# Patient Record
Sex: Female | Born: 1935 | Race: White | Hispanic: No | Smoking: Former smoker
Health system: Southern US, Community
[De-identification: ages and names within clinical notes are randomized; demographics above are authoritative.]

## PROBLEM LIST (undated history)

## (undated) DIAGNOSIS — I10 Essential (primary) hypertension: Secondary | ICD-10-CM

## (undated) DIAGNOSIS — K219 Gastro-esophageal reflux disease without esophagitis: Secondary | ICD-10-CM

## (undated) DIAGNOSIS — M1990 Unspecified osteoarthritis, unspecified site: Secondary | ICD-10-CM

## (undated) DIAGNOSIS — J449 Chronic obstructive pulmonary disease, unspecified: Secondary | ICD-10-CM

## (undated) DIAGNOSIS — E119 Type 2 diabetes mellitus without complications: Secondary | ICD-10-CM

## (undated) DIAGNOSIS — I739 Peripheral vascular disease, unspecified: Secondary | ICD-10-CM

## (undated) DIAGNOSIS — R609 Edema, unspecified: Secondary | ICD-10-CM

## (undated) HISTORY — DX: Edema, unspecified: R60.9

## (undated) HISTORY — DX: Essential (primary) hypertension: I10

## (undated) HISTORY — DX: Type 2 diabetes mellitus without complications: E11.9

## (undated) HISTORY — DX: Unspecified osteoarthritis, unspecified site: M19.90

## (undated) HISTORY — PX: GALLBLADDER SURGERY: SHX652

## (undated) HISTORY — PX: APPENDECTOMY: SHX54

## (undated) HISTORY — PX: ABDOMINAL HYSTERECTOMY: SHX81

---

## 2005-01-15 ENCOUNTER — Ambulatory Visit: Payer: Self-pay | Admitting: Internal Medicine

## 2005-03-11 ENCOUNTER — Ambulatory Visit: Payer: Self-pay | Admitting: Internal Medicine

## 2006-12-11 ENCOUNTER — Emergency Department: Payer: Self-pay | Admitting: Emergency Medicine

## 2008-03-24 ENCOUNTER — Inpatient Hospital Stay: Payer: Self-pay | Admitting: Internal Medicine

## 2008-03-24 ENCOUNTER — Other Ambulatory Visit: Payer: Self-pay

## 2008-11-21 ENCOUNTER — Inpatient Hospital Stay: Payer: Self-pay | Admitting: *Deleted

## 2008-11-26 ENCOUNTER — Encounter: Payer: Self-pay | Admitting: Internal Medicine

## 2008-12-01 ENCOUNTER — Encounter: Payer: Self-pay | Admitting: Internal Medicine

## 2009-07-10 ENCOUNTER — Inpatient Hospital Stay: Payer: Self-pay | Admitting: Internal Medicine

## 2009-12-22 ENCOUNTER — Emergency Department: Payer: Self-pay | Admitting: Emergency Medicine

## 2010-01-15 ENCOUNTER — Inpatient Hospital Stay: Payer: Self-pay | Admitting: Internal Medicine

## 2010-04-27 ENCOUNTER — Observation Stay: Payer: Self-pay | Admitting: Internal Medicine

## 2011-04-05 ENCOUNTER — Emergency Department: Payer: Self-pay | Admitting: *Deleted

## 2011-04-09 ENCOUNTER — Inpatient Hospital Stay: Payer: Self-pay | Admitting: Internal Medicine

## 2011-04-13 ENCOUNTER — Encounter: Payer: Self-pay | Admitting: Internal Medicine

## 2011-04-15 ENCOUNTER — Emergency Department: Payer: Self-pay | Admitting: Emergency Medicine

## 2011-05-04 ENCOUNTER — Encounter: Payer: Self-pay | Admitting: Internal Medicine

## 2011-06-04 ENCOUNTER — Encounter: Payer: Self-pay | Admitting: Internal Medicine

## 2011-06-05 ENCOUNTER — Inpatient Hospital Stay: Payer: Self-pay | Admitting: Internal Medicine

## 2011-07-04 ENCOUNTER — Encounter: Payer: Self-pay | Admitting: Internal Medicine

## 2011-08-04 ENCOUNTER — Encounter: Payer: Self-pay | Admitting: Internal Medicine

## 2011-08-29 ENCOUNTER — Emergency Department: Payer: Self-pay | Admitting: Internal Medicine

## 2011-08-29 LAB — TROPONIN I: Troponin-I: <0.02 ng/mL

## 2011-08-29 LAB — URINALYSIS, COMPLETE
Bilirubin,UR: NEGATIVE
Blood: NEGATIVE
Glucose,UR: NEGATIVE mg/dL (ref 0–75)
Nitrite: NEGATIVE
Ph: 7 (ref 4.5–8.0)

## 2011-08-29 LAB — COMPREHENSIVE METABOLIC PANEL
Albumin: 3.5 g/dL (ref 3.4–5.0)
Alkaline Phosphatase: 66 U/L (ref 50–136)
Anion Gap: 9 (ref 7–16)
Bilirubin,Total: 0.4 mg/dL (ref 0.2–1.0)
Chloride: 107 mmol/L (ref 98–107)
Co2: 28 mmol/L (ref 21–32)
Creatinine: 1.03 mg/dL (ref 0.60–1.30)
EGFR (African American): >60
EGFR (Non-African Amer.): 53 — ABNORMAL LOW
SGOT(AST): 15 U/L (ref 15–37)
SGPT (ALT): 15 U/L

## 2011-08-29 LAB — CBC
HCT: 38.8 % (ref 35.0–47.0)
HGB: 13 g/dL (ref 12.0–16.0)
MCHC: 33.5 g/dL (ref 32.0–36.0)
MCV: 91 fL (ref 80–100)
RBC: 4.26 10*6/uL (ref 3.80–5.20)
WBC: 9.4 10*3/uL (ref 3.6–11.0)

## 2011-08-29 LAB — LIPASE, BLOOD: Lipase: 122 U/L (ref 73–393)

## 2011-09-04 ENCOUNTER — Encounter: Payer: Self-pay | Admitting: Internal Medicine

## 2013-06-05 ENCOUNTER — Encounter: Payer: Self-pay | Admitting: Podiatry

## 2013-06-05 ENCOUNTER — Ambulatory Visit (INDEPENDENT_AMBULATORY_CARE_PROVIDER_SITE_OTHER): Payer: Medicare Other | Admitting: Podiatry

## 2013-06-05 VITALS — BP 145/76 | HR 75 | Resp 20 | Ht 65.0 in | Wt 135.0 lb

## 2013-06-05 DIAGNOSIS — B351 Tinea unguium: Secondary | ICD-10-CM

## 2013-06-05 DIAGNOSIS — M79609 Pain in unspecified limb: Secondary | ICD-10-CM

## 2013-06-05 NOTE — Progress Notes (Signed)
Jaryn presents today with a chief complaint of painful toenails one through 5 bilateral.  Objective: Vital signs are stable she is alert and oriented x3. Pulses are palpable bilateral. Nails are thick yellow dystrophic clinically mycotic.  Assessment: Pain in limb secondary to onychomycosis 1 through 5 bilateral.  Plan: Debridement of nails 1 through 5 bilateral is cover service secondary to pain. Followup with her in 3 months.

## 2013-09-04 ENCOUNTER — Ambulatory Visit: Payer: Medicare Other | Admitting: Podiatry

## 2013-10-25 ENCOUNTER — Ambulatory Visit (INDEPENDENT_AMBULATORY_CARE_PROVIDER_SITE_OTHER): Payer: Medicare Other | Admitting: Podiatry

## 2013-10-25 VITALS — BP 96/43 | HR 78 | Resp 16

## 2013-10-25 DIAGNOSIS — B351 Tinea unguium: Secondary | ICD-10-CM

## 2013-10-25 DIAGNOSIS — M79609 Pain in unspecified limb: Secondary | ICD-10-CM

## 2013-10-25 NOTE — Progress Notes (Signed)
She presents today with a chief complaint of painful toenails bilateral.  Objective: Vital signs are stable she is alert and oriented x3. Her nails are thick yellow dystrophic with mycotic and painful palpation.  Assessment: Pain in limb secondary to onychomycosis 1 through 5 bilateral.  Plan: Debridement of nails 1 through 5 bilateral.

## 2013-11-13 ENCOUNTER — Ambulatory Visit: Payer: Medicare Other | Admitting: Podiatry

## 2014-01-24 ENCOUNTER — Ambulatory Visit (INDEPENDENT_AMBULATORY_CARE_PROVIDER_SITE_OTHER): Payer: Medicare Other | Admitting: Podiatry

## 2014-01-24 ENCOUNTER — Encounter: Payer: Self-pay | Admitting: Podiatry

## 2014-01-24 DIAGNOSIS — E1149 Type 2 diabetes mellitus with other diabetic neurological complication: Secondary | ICD-10-CM

## 2014-01-24 DIAGNOSIS — Q828 Other specified congenital malformations of skin: Secondary | ICD-10-CM

## 2014-01-24 DIAGNOSIS — B351 Tinea unguium: Secondary | ICD-10-CM

## 2014-01-24 DIAGNOSIS — M79609 Pain in unspecified limb: Secondary | ICD-10-CM

## 2014-01-24 DIAGNOSIS — M79676 Pain in unspecified toe(s): Secondary | ICD-10-CM

## 2014-01-24 NOTE — Progress Notes (Signed)
   Subjective:    Patient ID: Kayla Huynh, female    DOB: Jan 26, 1914, 80 y.o.   MRN: [id number]  HPI Comments: Left big toe has a callus on it and i need my nails cut      Review of Systems     Objective:   Physical Exam: I have reviewed her past medical history medications and allergies. Pulses are palpable. Neurologic sensorium is intact. Nails are thick yellow dystrophic with mycotic painful palpation.        Assessment & Plan:  Assessment: Pain in limb secondary to onychomycosis 1 through 5 bilateral.  Plan: Debridement of nails in thickness and length as cover service secondary to pain

## 2014-02-15 ENCOUNTER — Emergency Department (HOSPITAL_COMMUNITY): Payer: Medicare Other

## 2014-02-15 ENCOUNTER — Encounter (HOSPITAL_COMMUNITY): Payer: Self-pay | Admitting: Emergency Medicine

## 2014-02-15 ENCOUNTER — Inpatient Hospital Stay (HOSPITAL_COMMUNITY): Payer: Medicare Other

## 2014-02-15 ENCOUNTER — Inpatient Hospital Stay (HOSPITAL_COMMUNITY)
Admission: EM | Admit: 2014-02-15 | Discharge: 2014-02-22 | DRG: 062 | Disposition: A | Discharge status: Skilled Nursing Facility | Payer: Medicare Other | Attending: Neurology | Admitting: Neurology

## 2014-02-15 DIAGNOSIS — I6340 Cerebral infarction due to embolism of unspecified cerebral artery: Secondary | ICD-10-CM | POA: Diagnosis present

## 2014-02-15 DIAGNOSIS — Z885 Allergy status to narcotic agent status: Secondary | ICD-10-CM

## 2014-02-15 DIAGNOSIS — J4489 Other specified chronic obstructive pulmonary disease: Secondary | ICD-10-CM | POA: Diagnosis present

## 2014-02-15 DIAGNOSIS — I6350 Cerebral infarction due to unspecified occlusion or stenosis of unspecified cerebral artery: Secondary | ICD-10-CM | POA: Diagnosis present

## 2014-02-15 DIAGNOSIS — R1311 Dysphagia, oral phase: Secondary | ICD-10-CM | POA: Diagnosis present

## 2014-02-15 DIAGNOSIS — Z880 Allergy status to penicillin: Secondary | ICD-10-CM

## 2014-02-15 DIAGNOSIS — E440 Moderate protein-calorie malnutrition: Secondary | ICD-10-CM | POA: Diagnosis present

## 2014-02-15 DIAGNOSIS — E785 Hyperlipidemia, unspecified: Secondary | ICD-10-CM | POA: Diagnosis present

## 2014-02-15 DIAGNOSIS — I498 Other specified cardiac arrhythmias: Secondary | ICD-10-CM | POA: Diagnosis present

## 2014-02-15 DIAGNOSIS — Z66 Do not resuscitate: Secondary | ICD-10-CM | POA: Diagnosis present

## 2014-02-15 DIAGNOSIS — E876 Hypokalemia: Secondary | ICD-10-CM | POA: Diagnosis not present

## 2014-02-15 DIAGNOSIS — IMO0002 Reserved for concepts with insufficient information to code with codable children: Secondary | ICD-10-CM | POA: Diagnosis not present

## 2014-02-15 DIAGNOSIS — Z515 Encounter for palliative care: Secondary | ICD-10-CM

## 2014-02-15 DIAGNOSIS — G8190 Hemiplegia, unspecified affecting unspecified side: Secondary | ICD-10-CM | POA: Diagnosis present

## 2014-02-15 DIAGNOSIS — I10 Essential (primary) hypertension: Secondary | ICD-10-CM | POA: Diagnosis present

## 2014-02-15 DIAGNOSIS — R414 Neurologic neglect syndrome: Secondary | ICD-10-CM | POA: Diagnosis present

## 2014-02-15 DIAGNOSIS — R4701 Aphasia: Secondary | ICD-10-CM | POA: Diagnosis present

## 2014-02-15 DIAGNOSIS — K219 Gastro-esophageal reflux disease without esophagitis: Secondary | ICD-10-CM | POA: Diagnosis present

## 2014-02-15 DIAGNOSIS — E119 Type 2 diabetes mellitus without complications: Secondary | ICD-10-CM | POA: Diagnosis present

## 2014-02-15 DIAGNOSIS — I4891 Unspecified atrial fibrillation: Secondary | ICD-10-CM | POA: Diagnosis present

## 2014-02-15 DIAGNOSIS — H53469 Homonymous bilateral field defects, unspecified side: Secondary | ICD-10-CM | POA: Diagnosis present

## 2014-02-15 DIAGNOSIS — J449 Chronic obstructive pulmonary disease, unspecified: Secondary | ICD-10-CM | POA: Diagnosis present

## 2014-02-15 DIAGNOSIS — R1313 Dysphagia, pharyngeal phase: Secondary | ICD-10-CM | POA: Diagnosis present

## 2014-02-15 DIAGNOSIS — I739 Peripheral vascular disease, unspecified: Secondary | ICD-10-CM | POA: Diagnosis present

## 2014-02-15 DIAGNOSIS — I639 Cerebral infarction, unspecified: Secondary | ICD-10-CM

## 2014-02-15 HISTORY — DX: Essential (primary) hypertension: I10

## 2014-02-15 HISTORY — DX: Type 2 diabetes mellitus without complications: E11.9

## 2014-02-15 HISTORY — DX: Gastro-esophageal reflux disease without esophagitis: K21.9

## 2014-02-15 HISTORY — DX: Peripheral vascular disease, unspecified: I73.9

## 2014-02-15 HISTORY — DX: Chronic obstructive pulmonary disease, unspecified: J44.9

## 2014-02-15 LAB — I-STAT CHEM 8, ED
BUN: 22 mg/dL (ref 6–23)
CHLORIDE: 109 meq/L (ref 96–112)
Calcium, Ion: 1.2 mmol/L (ref 1.13–1.30)
Creatinine, Ser: 1.1 mg/dL (ref 0.50–1.10)
Glucose, Bld: 107 mg/dL — ABNORMAL HIGH (ref 70–99)
HCT: 36 % (ref 36.0–46.0)
Hemoglobin: 12.2 g/dL (ref 12.0–15.0)
POTASSIUM: 4.2 meq/L (ref 3.7–5.3)
Sodium: 143 mEq/L (ref 137–147)
TCO2: 24 mmol/L (ref 0–100)

## 2014-02-15 LAB — COMPREHENSIVE METABOLIC PANEL
ALT: 7 U/L (ref 0–35)
AST: 12 U/L (ref 0–37)
Albumin: 3.3 g/dL — ABNORMAL LOW (ref 3.5–5.2)
Alkaline Phosphatase: 57 U/L (ref 39–117)
Anion gap: 15 (ref 5–15)
BUN: 20 mg/dL (ref 6–23)
CALCIUM: 8.9 mg/dL (ref 8.4–10.5)
CHLORIDE: 106 meq/L (ref 96–112)
CO2: 23 mEq/L (ref 19–32)
CREATININE: 1.03 mg/dL (ref 0.50–1.10)
GFR calc Af Amer: 51 mL/min — ABNORMAL LOW (ref >90)
GFR calc non Af Amer: 44 mL/min — ABNORMAL LOW (ref >90)
Glucose, Bld: 110 mg/dL — ABNORMAL HIGH (ref 70–99)
Potassium: 4.3 mEq/L (ref 3.7–5.3)
Sodium: 144 mEq/L (ref 137–147)
Total Bilirubin: 0.2 mg/dL — ABNORMAL LOW (ref 0.3–1.2)
Total Protein: 6.6 g/dL (ref 6.0–8.3)

## 2014-02-15 LAB — CBC
HEMATOCRIT: 34.4 % — AB (ref 36.0–46.0)
Hemoglobin: 10.8 g/dL — ABNORMAL LOW (ref 12.0–15.0)
MCH: 26.3 pg (ref 26.0–34.0)
MCHC: 31.4 g/dL (ref 30.0–36.0)
MCV: 83.7 fL (ref 78.0–100.0)
PLATELETS: 190 10*3/uL (ref 150–400)
RBC: 4.11 MIL/uL (ref 3.87–5.11)
RDW: 14.3 % (ref 11.5–15.5)
WBC: 6.6 10*3/uL (ref 4.0–10.5)

## 2014-02-15 LAB — URINALYSIS, ROUTINE W REFLEX MICROSCOPIC
BILIRUBIN URINE: NEGATIVE
Glucose, UA: NEGATIVE mg/dL
HGB URINE DIPSTICK: NEGATIVE
Ketones, ur: NEGATIVE mg/dL
Leukocytes, UA: NEGATIVE
Nitrite: NEGATIVE
PROTEIN: 30 mg/dL — AB
Specific Gravity, Urine: 1.014 (ref 1.005–1.030)
UROBILINOGEN UA: 1 mg/dL (ref 0.0–1.0)
pH: 7 (ref 5.0–8.0)

## 2014-02-15 LAB — URINE MICROSCOPIC-ADD ON

## 2014-02-15 LAB — CBG MONITORING, ED: Glucose-Capillary: 116 mg/dL — ABNORMAL HIGH (ref 70–99)

## 2014-02-15 LAB — DIFFERENTIAL
BASOS PCT: 0 % (ref 0–1)
Basophils Absolute: 0 10*3/uL (ref 0.0–0.1)
EOS PCT: 2 % (ref 0–5)
Eosinophils Absolute: 0.1 10*3/uL (ref 0.0–0.7)
Lymphocytes Relative: 28 % (ref 12–46)
Lymphs Abs: 1.9 10*3/uL (ref 0.7–4.0)
MONO ABS: 0.5 10*3/uL (ref 0.1–1.0)
Monocytes Relative: 8 % (ref 3–12)
NEUTROS ABS: 4 10*3/uL (ref 1.7–7.7)
Neutrophils Relative %: 62 % (ref 43–77)

## 2014-02-15 LAB — RAPID URINE DRUG SCREEN, HOSP PERFORMED
AMPHETAMINES: NOT DETECTED
Barbiturates: NOT DETECTED
Benzodiazepines: NOT DETECTED
COCAINE: NOT DETECTED
OPIATES: NOT DETECTED
TETRAHYDROCANNABINOL: NOT DETECTED

## 2014-02-15 LAB — PROTIME-INR
INR: 1.11 (ref 0.00–1.49)
Prothrombin Time: 14.3 seconds (ref 11.6–15.2)

## 2014-02-15 LAB — GLUCOSE, CAPILLARY: Glucose-Capillary: 123 mg/dL — ABNORMAL HIGH (ref 70–99)

## 2014-02-15 LAB — I-STAT TROPONIN, ED: TROPONIN I, POC: 0.01 ng/mL (ref 0.00–0.08)

## 2014-02-15 LAB — APTT: aPTT: 26 seconds (ref 24–37)

## 2014-02-15 LAB — ETHANOL

## 2014-02-15 LAB — MRSA PCR SCREENING: MRSA BY PCR: NEGATIVE

## 2014-02-15 MED ORDER — BIOTENE DRY MOUTH MT LIQD
15.0000 mL | Freq: Two times a day (BID) | OROMUCOSAL | Status: DC
  Administered 2014-02-15 – 2014-02-22 (×14): 15 mL via OROMUCOSAL

## 2014-02-15 MED ORDER — ACETAMINOPHEN 650 MG RE SUPP
650.0000 mg | RECTAL | Status: DC | PRN
  Administered 2014-02-16: 650 mg via RECTAL
  Filled 2014-02-15: qty 1

## 2014-02-15 MED ORDER — PANTOPRAZOLE SODIUM 40 MG IV SOLR
40.0000 mg | Freq: Every day | INTRAVENOUS | Status: DC
  Administered 2014-02-15 – 2014-02-18 (×4): 40 mg via INTRAVENOUS
  Filled 2014-02-15 (×7): qty 40

## 2014-02-15 MED ORDER — ACETAMINOPHEN 325 MG PO TABS: 650.0000 mg | ORAL_TABLET | ORAL | Status: DC | PRN

## 2014-02-15 MED ORDER — LABETALOL HCL 5 MG/ML IV SOLN
INTRAVENOUS | Status: AC
  Filled 2014-02-15: qty 4

## 2014-02-15 MED ORDER — SENNOSIDES-DOCUSATE SODIUM 8.6-50 MG PO TABS
1.0000 | ORAL_TABLET | Freq: Every evening | ORAL | Status: DC | PRN
  Filled 2014-02-15: qty 1

## 2014-02-15 MED ORDER — ALTEPLASE (STROKE) FULL DOSE INFUSION
54.0000 mg | Freq: Once | INTRAVENOUS | Status: AC
  Administered 2014-02-15: 54 mg via INTRAVENOUS
  Filled 2014-02-15: qty 54

## 2014-02-15 MED ORDER — LABETALOL HCL 5 MG/ML IV SOLN
10.0000 mg | INTRAVENOUS | Status: DC | PRN
  Administered 2014-02-15 – 2014-02-16 (×5): 10 mg via INTRAVENOUS
  Filled 2014-02-15 (×3): qty 4

## 2014-02-15 MED ORDER — HYDRALAZINE HCL 20 MG/ML IJ SOLN
INTRAMUSCULAR | Status: AC
  Administered 2014-02-15: 10 mg
  Filled 2014-02-15: qty 1

## 2014-02-15 MED ORDER — STROKE: EARLY STAGES OF RECOVERY BOOK
Freq: Once | Status: DC
  Filled 2014-02-15: qty 1

## 2014-02-15 MED ORDER — HYDRALAZINE HCL 20 MG/ML IJ SOLN
10.0000 mg | Freq: Once | INTRAMUSCULAR | Status: DC
  Filled 2014-02-15: qty 1

## 2014-02-15 MED ORDER — SODIUM CHLORIDE 0.9 % IV SOLN
INTRAVENOUS | Status: DC
  Administered 2014-02-15: 19:00:00 via INTRAVENOUS
  Administered 2014-02-16: 1000 mL via INTRAVENOUS
  Administered 2014-02-17: 14:00:00 via INTRAVENOUS
  Administered 2014-02-18: 1000 mL via INTRAVENOUS
  Administered 2014-02-18 – 2014-02-21 (×5): via INTRAVENOUS
  Administered 2014-02-22: 500 mL via INTRAVENOUS

## 2014-02-15 NOTE — ED Provider Notes (Signed)
Code stroke via EMS. Patient is  Aphasic but protecting airway at 1400.  Airway is patent for CT scan. R sided deficits.  Last seen normal at 1400.  There were no vitals taken for this visit.    Glynn OctaveStephen Benney [place], MD 02/15/14 1450

## 2014-02-15 NOTE — Progress Notes (Signed)
Heart rate went down to 32, not sustained. Goes down to 40's more frequently. Pt is in A. Fib. Patient remains aphasic and does not show any symptoms SOB or chest pain. Dr. Thad Rangereynolds notified and was told to continue watching closely. No new orders at this time. Also clarified code status since ED notes saying DNR but ok for intubation, however no order for DNR. Received order to continue full code at this time and will readdress this in the morning.

## 2014-02-15 NOTE — ED Notes (Signed)
Per family timeline, LSN 1225.

## 2014-02-15 NOTE — ED Provider Notes (Addendum)
CSN: [id number]     Arrival date & time 02/15/14  1445 History   First MD Initiated Contact with Patient 02/15/14 1508     Chief Complaint  Patient presents with  . Code Stroke     (Consider location/radiation/quality/duration/timing/severity/associated sxs/prior Treatment) HPI  This is a 80 year old female with a history of diabetes, hypertension, COPD, and peripheral vascular disease who presents as a code stroke.  Patient is cared for primarily by her grandson. The grandson states that he took her for a hair appointment at 11:25 this morning.  By 1225 to 12:30, patient had returned home and was eating her lunch. This is last name normal. The grandson states that he walked in at 2:05 PM and patient was slumped over. Per EMS, she was noted to be a phasic with right-sided paralysis. She is not following commands. She was evaluated upon EMS arrival and sent to the CT scan. Per neurology, patient has a dense MCA sign on the left.  Patient's daughters are at the bedside. Discussion regarding TPA and further intervention is being had with the neurologist. Patient is just within the three-hour window given last seen normal at 12:25 PM.  There is no evidence of bleed on CT. Daughters okay if the patient requires intubation; however, they have requested no chest compressions were advanced cardiac medications if the patient's heart were to stop.  Patient has not enjoyed to history taking. Level V caveat.  Past Medical History  Diagnosis Date  . Diabetes mellitus without complication   . Hypertension   . COPD (chronic obstructive pulmonary disease)   . PVD (peripheral vascular disease)   . Acid reflux    History reviewed. No pertinent past surgical history. History reviewed. No pertinent family history. History  Substance Use Topics  . Smoking status: Never Smoker   . Smokeless tobacco: Not on file  . Alcohol Use: Not on file   OB History   Grav Para Term Preterm Abortions TAB SAB Ect Mult  Living                 Review of Systems  Unable to perform ROS: Mental status change      Allergies  Morphine and related and Penicillins  Home Medications   Prior to Admission medications   Not on File   BP 187/71  Pulse 65  Temp(Src) 97.6 F (36.4 C) (Oral)  Resp 20  Wt 135 lb 2.3 oz (61.3 kg)  SpO2 100% Physical Exam  Nursing note and vitals reviewed. Constitutional:  Elderly, awake but not responding to questioning, airway intact  HENT:  Head: Normocephalic and atraumatic.  Mouth/Throat: Oropharynx is clear and moist.  Eyes: Pupils are equal, round, and reactive to light.  Pupils 4 mm and reactive bilaterally  Neck: Neck supple.  Cardiovascular: Normal rate, regular rhythm and normal heart sounds.   No murmur heard. Pulmonary/Chest: Effort normal and breath sounds normal. No respiratory distress. She has no wheezes.  Abdominal: Soft. Bowel sounds are normal. There is no tenderness. There is no rebound.  Musculoskeletal: She exhibits no edema.  Neurological:  Alert but not following commands, gave preference to the left, neglect noted on the right, difficult to assess strength but appears to have flaccid paralysis on the right, is aphasic  Skin: Skin is warm and dry.  Psychiatric: She has a normal mood and affect.    ED Course  Procedures (including critical care time)  CRITICAL CARE Performed by: Ross MarcusHORTON, Whitnie Deleon, F   Total critical care  time: 35 min  Critical care time was exclusive of separately billable procedures and treating other patients.  Critical care was necessary to treat or prevent imminent or life-threatening deterioration.  Critical care was time spent personally by me on the following activities: development of treatment plan with patient and/or surrogate as well as nursing, discussions with consultants, evaluation of patient's response to treatment, examination of patient, obtaining history from patient or surrogate, ordering and  performing treatments and interventions, ordering and review of laboratory studies, ordering and review of radiographic studies, pulse oximetry and re-evaluation of patient's condition.   Labs Review Labs Reviewed  CBC - Abnormal; Notable for the following:    Hemoglobin 10.8 (*)    HCT 34.4 (*)    All other components within normal limits  COMPREHENSIVE METABOLIC PANEL - Abnormal; Notable for the following:    Glucose, Bld 110 (*)    Albumin 3.3 (*)    Total Bilirubin 0.2 (*)    GFR calc non Af Amer 44 (*)    GFR calc Af Amer 51 (*)    All other components within normal limits  I-STAT CHEM 8, ED - Abnormal; Notable for the following:    Glucose, Bld 107 (*)    All other components within normal limits  CBG MONITORING, ED - Abnormal; Notable for the following:    Glucose-Capillary 116 (*)    All other components within normal limits  ETHANOL  PROTIME-INR  APTT  DIFFERENTIAL  URINE RAPID DRUG SCREEN (HOSP PERFORMED)  URINALYSIS, ROUTINE W REFLEX MICROSCOPIC  I-STAT TROPOININ, ED  I-STAT TROPOININ, ED    Imaging Review Ct Head Wo Contrast  02/15/2014   CLINICAL DATA:  Code stroke.  Right-sided weakness.  EXAM: CT HEAD WITHOUT CONTRAST  TECHNIQUE: Contiguous axial images were obtained from the base of the skull through the vertex without intravenous contrast.  COMPARISON:  None.  FINDINGS: The brain shows generalized atrophy. There are chronic small-vessel ischemic changes affecting the deep white matter. There appears to be a dense left MCA. There is loss of gray white discrimination in the insular and posterior frontal region. No hemorrhage, swelling, hydrocephalus or extra-axial collection. No calvarial abnormality. Sinuses, middle ears and mastoids are clear.  IMPRESSION: Atrophy and chronic small vessel ischemic changes.  Probable hyperdense left MCA and early loss of gray-white discrimination in the insula and posterior frontal region on the left. No hemorrhage.  These results  were called by telephone at the time of interpretation on 02/15/2014 at 3:01 pm to Dr. Leroy Kennedy , who verbally acknowledged these results.   Electronically Signed   By: Paulina Fusi M.D.   On: 02/15/2014 15:03     EKG Interpretation   Date/Time:  Thursday February 15 2014 15:02:38 EDT Ventricular Rate:  77 PR Interval:  146 QRS Duration: 129 QT Interval:  518 QTC Calculation: 586 R Axis:   -115 Text Interpretation:  Sinus Rhythm with IVCD Multiform ventricular  premature complexes Nonspecific IVCD with LAD No prior for comparison  Confirmed by Charmon Thorson  MD, Toni Amend ([id number]) on 02/15/2014 4:16:15 PM      MDM   Final diagnoses:  Stroke    Patient presents as a code stroke. He appears to have right-sided deficits and aphasia. Found to have a dense MCA sign on CT scan without bleed. She is within the 3 hour TPA window. Neurology has discussed risks and benefits with the family and we'll proceed with TPA.  She is DO NOT RESUSCITATE; however, if she were to  need intubation this would be okay. Patient will be admitted to the ICU following TPA administration.    Shon Baton, MD 02/15/14 1553  Shon Baton, MD 02/15/14 [phone]

## 2014-02-15 NOTE — ED Notes (Signed)
Was sitting in chair and son went out to do yard work and came back in to give pt meds and pt was found to have rt side flaccid not able to move rt arm or leg asphasic and not following commands last seen normal 1405

## 2014-02-15 NOTE — H&P (Signed)
H&P    Chief Complaint: Stroke   HPI:                                                                                                                                         Kayla Huynh is an 80 y.o. female who lives with her grandson who is her primary care giver.  At baseline she is fairly functional, able to wash and bath herself, walk with walker, able to feed herself and groom herself.  She was last seen normal at 12:35. When Grandson came back to check on her she was noted to be slumped over and not following commands. EMS was called and patient was brought to ED as a code stroke. On arrival she had a dense right right hemiplegia, right hemianopsia and right sided neglect. CT brain showed a probable hyperdense left MCA sign and early loss of gray-white  discrimination in the insula and posterior frontal region on the left Discussion was had with family and family decided they would like tPA to be given as she is within the 3 hour window and functional at home.   Date last known well: Date: 02/15/2014 Time last known well: Time: 12:25 NIHSS: 22 tPA Given: Yes  Past Medical History  Diagnosis Date  . Diabetes mellitus without complication   . Hypertension   . COPD (chronic obstructive pulmonary disease)   . PVD (peripheral vascular disease)   . Acid reflux     History reviewed. No pertinent past surgical history.  History reviewed. No pertinent family history. Social History:  reports that she has never smoked. She does not have any smokeless tobacco history on file. Her alcohol and drug histories are not on file.  Allergies:  Allergies  Allergen Reactions  . Morphine And Related   . Penicillins     Medications:                                                                                                                           Current Facility-Administered Medications  Medication Dose Route Frequency Provider Last Rate Last Dose  . alteplase (ACTIVASE) 1  mg/mL infusion 54 mg  54 mg Intravenous Once Shon Baton, MD 54 mL/hr at 02/15/14 1522 54 mg at 02/15/14 1522   No current outpatient prescriptions on file.     ROS:  History obtained from unobtainable from patient due to language barrier and mental status  Blood pressure 169/56, pulse 65, temperature 97.6 F (36.4 C), temperature source Oral, resp. rate 21, weight 61.3 kg (135 lb 2.3 oz), SpO2 100.00%.  Physical Exam  Cardiovascular: Normal rate and normal heart sounds.   Respiratory: Breath sounds normal.  GI: Bowel sounds are normal.   Neurologic Examination:                                                                                                      Mental Status: Alert, shows both expressive and receptive aphasia, able to mimic some visual commands. Cranial Nerves: II: Discs flat bilaterally; Visual fields right hemianopsia, pupils equal, round, reactive to light and accommodation III,IV, VI: ptosis not present, left gaze preference able to cross midline with doll's maneuver V,VII: smile asymmetric right, winced to pain on the left to PP VIII: hearing normal bilaterally IX,X: gag reflex present XI: bilateral shoulder shrug XII: midline tongue extension without atrophy or fasciculations  Motor: Right : Upper extremity   0/5    Left:     Upper extremity   4/5  Lower extremity   0/5     Lower extremity   4/5 Tone and bulk:normal tone throughout; no atrophy noted Sensory: Pinprick and light touch intact bilaterally to pain Deep Tendon Reflexes:  Right: Upper Extremity   Left: Upper extremity   biceps (C-5 to C-6) 2/4   biceps (C-5 to C-6) 2/4 tricep (C7) 2/4    triceps (C7) 2/4 Brachioradialis (C6) 2/4  Brachioradialis (C6) 2/4  Lower Extremity Lower Extremity  quadriceps (L-2 to L-4) 2/4   quadriceps (L-2 to L-4) 2/4 Achilles  (S1) 0/4   Achilles (S1) 0/4  Plantars: Right: up going   Left: downgoing Cerebellar: Unable to assess Gait: unable to assess CV: pulses palpable throughout     Lab Results: Basic Metabolic Panel:  Recent Labs Lab 02/15/14 1525  NA 143  K 4.2  CL 109  GLUCOSE 107*  BUN 22  CREATININE 1.10    Liver Function Tests: No results found for this basename: AST, ALT, ALKPHOS, BILITOT, PROT, ALBUMIN,  in the last 168 hours No results found for this basename: LIPASE, AMYLASE,  in the last 168 hours No results found for this basename: AMMONIA,  in the last 168 hours  CBC:  Recent Labs Lab 02/15/14 1446 02/15/14 1525  WBC 6.6  --   NEUTROABS 4.0  --   HGB 10.8* 12.2  HCT 34.4* 36.0  MCV 83.7  --   PLT 190  --     Cardiac Enzymes: No results found for this basename: CKTOTAL, CKMB, CKMBINDEX, TROPONINI,  in the last 168 hours  Lipid Panel: No results found for this basename: CHOL, TRIG, HDL, CHOLHDL, VLDL, LDLCALC,  in the last 168 hours  CBG:  Recent Labs Lab 02/15/14 1458  GLUCAP 116*    Microbiology: No results found for this or any previous visit.  Coagulation Studies:  Recent Labs  02/15/14 1446  LABPROT 14.3  INR 1.11  Imaging: Ct Head Wo Contrast  02/15/2014   CLINICAL DATA:  Code stroke.  Right-sided weakness.  EXAM: CT HEAD WITHOUT CONTRAST  TECHNIQUE: Contiguous axial images were obtained from the base of the skull through the vertex without intravenous contrast.  COMPARISON:  None.  FINDINGS: The brain shows generalized atrophy. There are chronic small-vessel ischemic changes affecting the deep white matter. There appears to be a dense left MCA. There is loss of gray white discrimination in the insular and posterior frontal region. No hemorrhage, swelling, hydrocephalus or extra-axial collection. No calvarial abnormality. Sinuses, middle ears and mastoids are clear.  IMPRESSION: Atrophy and chronic small vessel ischemic changes.  Probable  hyperdense left MCA and early loss of gray-white discrimination in the insula and posterior frontal region on the left. No hemorrhage.  These results were called by telephone at the time of interpretation on 02/15/2014 at 3:01 pm to Dr. Leroy Kennedyamilo , who verbally acknowledged these results.   Electronically Signed   By: Paulina FusiMark  Shogry M.D.   On: 02/15/2014 15:03       Assessment and plan discussed with with attending physician and they are in agreement.    Felicie MornDavid Smith PA-C [facility] [phone]  02/15/2014, 3:27 PM   Assessment: 80 y.o. female presenting to the ED with acute onset of right hemiplegia, right hemianopsia, and CT finding of probable left MCA sign. LSN was 12:25 and tPA was administered.  Intervention was considered however patient was deemed out of age range.  Patient will be admitted to 3100 for further care and evaluation.   Stroke Risk Factors - diabetes mellitus and hypertension  1. HgbA1c, fasting lipid panel 2. MRI, MRA  of the brain without contrast, CT head AM 3. PT consult, OT consult, Speech consult 4. Echocardiogram 5. Carotid dopplers 6. Prophylactic therapy-None 7. Risk factor modification 8. Telemetry monitoring 9. Frequent neuro checks 10 BP management <180/105 11. Stroke team to follow in AM  Patient seen and evaluated together with physician assistant and I concur with the above assessment and plan.  Wyatt Portelasvaldo Lyncoln Maskell, MD [facility].

## 2014-02-15 NOTE — Code Documentation (Signed)
80 [facility] as code stroke.  According to grandson patient went to hair appointment today at 1130 - [facility] and when he saw her last at 1225 she was eating and all was normal.  He rechecked her later and found her unresponsive and slumped over in her chair.  She was called code stroke in the field.  On arrival she is alert - warm and dry - mute - left gaze preference - can follow one command - right hemiplegia - right leg moves to DPS only - nothing with right arm.  CT scan done - Dr. Cyril Mourningamillo present  - reading scan with radiologist.  Patient protecting airway at this time.  NIHSS 22.  BP 170/87 HR with some short runs of irregularity = no hx afib per family.  CBG 107.  Family speaking with Dr. Cyril Mourningamillo - tpa started at 1522.  Blood pressure in the 180's at the 1/2 way point of tpa infusion - HR has been 63-72 - Dr. Cyril Mourningamillo paged - Hydralazine 10 mg IV given once.  BP responded - now in the 160-range.  As tpa was finishing -very small amount of upper palate bleeding noted with routine oral check - no tongue or lip swelling noted - small amount of oozing from right leg abrasion.  Patient attempting to speak - moaning some - attempts to inquire about headache - patient not able to nod yes or no - moaning ceased with out intervention.  Bil BS = clear.  HOB up to 20 degrees for airway protection with palate oozing.  Oropharnyx cleaned and light suction.  Dr. Cyril Mourningamillo aware.  BP down to 130's - 250 cc NS bolus given per stroke protocol.  BP back up to 160's - NS at 75 cc/hr per orders.  Seems more aware of right side - turns to look right on occasion with calling of name.  Otherwise neuro same.  Awaiting bed on 84M.  Handoff to [employer] RN.  Call as needed.

## 2014-02-16 ENCOUNTER — Inpatient Hospital Stay (HOSPITAL_COMMUNITY): Payer: Medicare Other

## 2014-02-16 DIAGNOSIS — I059 Rheumatic mitral valve disease, unspecified: Secondary | ICD-10-CM

## 2014-02-16 LAB — GLUCOSE, CAPILLARY
GLUCOSE-CAPILLARY: 104 mg/dL — AB (ref 70–99)
GLUCOSE-CAPILLARY: 110 mg/dL — AB (ref 70–99)
GLUCOSE-CAPILLARY: 130 mg/dL — AB (ref 70–99)
Glucose-Capillary: 101 mg/dL — ABNORMAL HIGH (ref 70–99)

## 2014-02-16 LAB — HEMOGLOBIN A1C
HEMOGLOBIN A1C: 6.1 % — AB (ref <5.7)
Mean Plasma Glucose: 128 mg/dL — ABNORMAL HIGH (ref <117)

## 2014-02-16 LAB — LIPID PANEL
Cholesterol: 173 mg/dL (ref 0–200)
HDL: 33 mg/dL — ABNORMAL LOW (ref >39)
LDL Cholesterol: 102 mg/dL — ABNORMAL HIGH (ref 0–99)
Total CHOL/HDL Ratio: 5.2 RATIO
Triglycerides: 189 mg/dL — ABNORMAL HIGH (ref <150)
VLDL: 38 mg/dL (ref 0–40)

## 2014-02-16 MED ORDER — ASPIRIN 300 MG RE SUPP
300.0000 mg | Freq: Once | RECTAL | Status: AC
  Administered 2014-02-16: 300 mg via RECTAL
  Filled 2014-02-16: qty 1

## 2014-02-16 NOTE — Evaluation (Addendum)
Physical Therapy Evaluation Patient Details Name: Armida Vickroy MRN: [id number] DOB: [birth year] Today's Date: 02/16/2014   History of Present Illness  Marykay Mccleod is an 80 y.o. female who lives with her grandson who is her primary care giver.  At baseline she is fairly functional, able to wash and bath herself, walk with walker, able to feed herself and groom herself.  She was last seen normal at 12:35. When Grandson came back to check on her she was noted to be slumped over and not following commands. EMS was called and patient was brought to ED as a code stroke for admission on 02/15/14. On arrival she had a dense right right hemiplegia, right hemianopsia and right sided neglect. Patient with initial NIHSS of 22. Patient received tPA on 02/15/14 at 15:22.  Post NIHSS was 23.  Spoke with Annie Main from Stroke Team.  Jasmine December states OK to begin therapy evaluations - discontinued 24 hour bedrest following tPA.  Orders for mobility provided.  Clinical Impression  Patient presents with problems listed below.  Will benefit from acute PT to work toward functional mobility goals prior to discharge.  Unsure that family can provide level of assist needed at this time.  Recommend SNF at discharge for continued therapy.    Follow Up Recommendations SNF;Supervision/Assistance - 24 hour    Equipment Recommendations  None recommended by PT    Recommendations for Other Services       Precautions / Restrictions Precautions Precautions: Fall Restrictions Weight Bearing Restrictions: No      Mobility  Bed Mobility Overal bed mobility: Needs Assistance Bed Mobility: Rolling Rolling: Total assist;+2 for safety/equipment         General bed mobility comments: Patient unable to initiate movement to roll.  Transfers                 General transfer comment: not tested  Ambulation/Gait                Stairs            Wheelchair Mobility    Modified Rankin (Stroke Patients  Only) Modified Rankin (Stroke Patients Only) Pre-Morbid Rankin Score: Moderate disability Modified Rankin: Severe disability     Balance                                             Pertinent Vitals/Pain     Home Living Family/patient expects to be discharged to:: Skilled nursing facility Living Arrangements: Children Lucila Maine is caregiver)             Home Equipment: Environmental consultant - 2 wheels;Shower seat;Bedside Engineer, manufacturing)      Prior Function Level of Independence: Needs assistance;Independent with assistive device(s)   Gait / Transfers Assistance Needed: Patient able to ambulate short distances with RW - grandson provides supervision with gait  ADL's / Homemaking Assistance Needed: Total assist for meal prep and housekeeping.  Assist for bathing.  Patient able to dress self.  Comments: Patient unable to provide information.  Daughter present and providing information.     Hand Dominance        Extremity/Trunk Assessment   Upper Extremity Assessment: RUE deficits/detail (Voluntary movement noted LUE) RUE Deficits / Details: No movement noted         Lower Extremity Assessment: RLE deficits/detail;LLE deficits/detail RLE Deficits / Details: Noted movement at hip and knee  of 3-/5.  Full PROM noted. LLE Deficits / Details: Voluntary movement noted.  Strength at least 3/5.  Unable to MMT.  Cervical / Trunk Assessment: Other exceptions  Communication   Communication: Receptive difficulties;Expressive difficulties  Cognition Arousal/Alertness: Lethargic Behavior During Therapy: Flat affect Overall Cognitive Status: Difficult to assess Area of Impairment: Attention;Following commands   Current Attention Level: Focused   Following Commands:  (Will follow commands with visual cuing < 50%)       General Comments: Patient unable to verbalize.  Rt neglect noted.      General Comments      Exercises         Assessment/Plan    PT Assessment Patient needs continued PT services  PT Diagnosis Difficulty walking;Hemiplegia dominant side;Altered mental status   PT Problem List Decreased strength;Decreased activity tolerance;Decreased balance;Decreased mobility;Decreased cognition;Decreased knowledge of use of DME;Decreased knowledge of precautions  PT Treatment Interventions DME instruction;Gait training;Functional mobility training;Therapeutic exercise;Balance training;Neuromuscular re-education;Cognitive remediation;Patient/family education   PT Goals (Current goals can be found in the Care Plan section) Acute Rehab PT Goals Patient Stated Goal: Unable to state PT Goal Formulation: With patient/family Time For Goal Achievement: 03/02/14 Potential to Achieve Goals: Fair    Frequency Min 2X/week   Barriers to discharge   Unsure that family can provided needed assist.    Co-evaluation               End of Session   Activity Tolerance: Patient limited by lethargy Patient left: in bed;with call bell/phone within reach;with family/visitor present           Time: 1207-1225 PT Time Calculation (min): 18 min   Charges:   PT Evaluation $Initial PT Evaluation Tier I: 1 Procedure PT Treatments $Therapeutic Activity: 8-22 mins   PT G Codes:          Vena AustriaDavis, Elisandro Jarrett H 02/16/2014, 1:27 PM Durenda HurtSusan H. Renaldo Fiddleravis, PT, [facility] Acute Rehab Services Pager [phone]

## 2014-02-16 NOTE — Evaluation (Signed)
Occupational Therapy Evaluation Patient Details Name: Kayla Huynh MRN: [id number] DOB: 1914/03/13 Today's Date: 02/16/2014    History of Present Illness Kayla Huynh is an 80 y.o. female who lives with her grandson who is her primary care giver.  At baseline she is fairly functional, able to wash and bath herself, walk with walker, able to feed herself and groom herself.  She was last seen normal at 12:35. When Grandson came back to check on her she was noted to be slumped over and not following commands. EMS was called and patient was brought to ED as a code stroke for admission on 02/15/14. On arrival she had a dense right right hemiplegia, right hemianopsia and right sided neglect. Patient with initial NIHSS of 22. Patient received tPA on 02/15/14 at 15:22.  Post NIHSS was 23.     Clinical Impression   Pt admitted with above. She demonstrates the below listed deficits and will benefit from continued OT to maximize safety and independence with BADLs.  Pt was restless during OT session. Unable to follow commands.  When objects placed in her hand, she does not attempt to manipulate them or explore them.  She does not attempt to assist with simple grooming tasks when hand over hand assist provided.  She has a Lt gaze preference, but did look to therapist on the Rt x 1 with max verbal stimuli.  Minimal spontaneous movement of Rt UE noted x 1 - pt pulled Rt UE across body.   Was unable to move pt to EOB safely with +1 assist.  Attempted, but pt pushed back significantly against therapist.  This coupled with the severity of her communication, cognitive deficits and the level of restlessness, made it unsafe.   She is likely going to require SNF level care at discharge.       Follow Up Recommendations  SNF    Equipment Recommendations  None recommended by OT    Recommendations for Other Services       Precautions / Restrictions Precautions Precautions: Fall      Mobility Bed Mobility Overal  bed mobility: Needs Assistance Bed Mobility: Rolling Rolling: Total assist         General bed mobility comments: Pt pushes against therapist.  Does not assist  Transfers                 General transfer comment:  (unable to perform safely with +1 assist)    Balance                                            ADL Overall ADL's : Needs assistance/impaired Eating/Feeding: NPO   Grooming: Wash/dry hands;Wash/dry face;Brushing hair;Total assistance Grooming Details (indicate cue type and reason): Pt made no attempts to assist despite hand over hand cues and assistance.   Upper Body Bathing: Total assistance;Bed level   Lower Body Bathing: Total assistance;Bed level   Upper Body Dressing : Total assistance;Bed level   Lower Body Dressing: Total assistance;Bed level   Toilet Transfer: Total assistance (unable)   Toileting- Clothing Manipulation and Hygiene: Total assistance;Bed level         General ADL Comments: Pt very restless.  Appears to be fixated on IV's on Lt hand and mitten restraint when it was on Lt hand.  When objects placed in her left hand she makes no attempt to manipulate them or explore  them.  Attempted to move her to seated position, but pt pushed back against therapist.  Unable to safely move her to EOB given amount of resistance and her inabilty to follow commands and level of restlessness     Vision                 Additional Comments: unable to assess due to communication deficits.  Pt with Lt gaze preference.  She did turn her head to the right x 1 and looked at therapist with max cues   Perception Perception Perception Tested?: Yes Perception Deficits: Inattention/neglect Inattention/Neglect: Does not attend to right visual field;Does not attend to right side of body   Praxis Praxis Praxis tested?: Deficits Deficits: Initiation;Ideation;Ideomotor Praxis-Other Comments: Does not initiate activity, does not  demonstrate use of objects.      Pertinent Vitals/Pain See vitals flow sheet     Hand Dominance     Extremity/Trunk Assessment Upper Extremity Assessment Upper Extremity Assessment: RUE deficits/detail RUE Deficits / Details: Pt with minimal spontaneous movement of Rt. UE.   She pulled it across her body x 1.  Otherwise no other movement noted.  PROM WFL   Lower Extremity Assessment Lower Extremity Assessment: Defer to PT evaluation   Cervical / Trunk Assessment Cervical / Trunk Exceptions: Pt with Lt gaze preference, but did turn her head to the Rt x 1   Communication Communication Communication: Receptive difficulties;Expressive difficulties   Cognition Arousal/Alertness: Awake/alert Behavior During Therapy: Anxious;Restless Overall Cognitive Status: Impaired/Different from baseline     Current Attention Level: Focused   Following Commands:  (Followed no commands during OT session)           General Comments       Exercises       Shoulder Instructions      Home Living Family/patient expects to be discharged to:: Skilled nursing facility Living Arrangements: Children Lucila Maine is caregiver)                           Home Equipment: Dan Humphreys - 2 wheels;Shower seat;Bedside commode;Transport chair (Lift chair)          Prior Functioning/Environment Level of Independence: Needs assistance;Independent with assistive device(s)  Gait / Transfers Assistance Needed: Patient able to ambulate short distances with RW - grandson provides supervision with gait ADL's / Homemaking Assistance Needed: Total assist for meal prep and housekeeping.  Assist for bathing.  Patient able to dress self.   Comments: Patient unable to provide information.  Daughter present and providing information.    OT Diagnosis: Generalized weakness;Cognitive deficits;Disturbance of vision;Hemiplegia dominant side   OT Problem List: Decreased strength;Decreased range of  motion;Decreased activity tolerance;Impaired balance (sitting and/or standing);Impaired vision/perception;Decreased coordination;Decreased cognition;Decreased safety awareness;Decreased knowledge of use of DME or AE;Impaired UE functional use;Impaired sensation   OT Treatment/Interventions: Self-care/ADL training;Neuromuscular education;DME and/or AE instruction;Therapeutic activities;Splinting;Cognitive remediation/compensation;Visual/perceptual remediation/compensation;Patient/family education;Balance training    OT Goals(Current goals can be found in the care plan section) Acute Rehab OT Goals OT Goal Formulation: Patient unable to participate in goal setting Time For Goal Achievement: 03/02/14 Potential to Achieve Goals: Fair ADL Goals Pt Will Perform Grooming: with mod assist;sitting (to wash face and comb hair) Additional ADL Goal #1: Pt will follow one step commands during familiar ADL task with gestural and tactile cues Additional ADL Goal #2: Pt will sit EOB with mod A while performing simple self care activity Additional ADL Goal #3: Pt will locate items presented in Rt  visual field 50% of the time.   OT Frequency: Min 2X/week   Barriers to D/C: Decreased caregiver support  unsure that family can provide level of care needed       Co-evaluation              End of Session Nurse Communication: Mobility status  Activity Tolerance: Other (comment) (communication, cognition, and restlessness) Patient left: in bed;with call bell/phone within reach;with bed alarm set   Time: 1610-1630 OT Time Calculation (min): 20 min Charges:  OT General Charges $OT Visit: 1 Procedure OT Treatments $Self Care/Home Management : 8-22 mins G-Codes:    Hercules Hasler M 02/16/2014, 7:15 PM

## 2014-02-16 NOTE — Progress Notes (Signed)
Echocardiogram 2D Echocardiogram has been performed.  Kayla Huynh 02/16/2014, 10:40 AM

## 2014-02-16 NOTE — Progress Notes (Signed)
Pt agitated at this time.  Spoke with Annie MainSharon Biby, NP about pt's current condition.  Going for CT 24 hr post- tpa at this time; to readdress MRI tomorrow.  Will continue to monitor pt.

## 2014-02-16 NOTE — Evaluation (Signed)
Clinical/Bedside Swallow Evaluation Patient Details  Name: Kayla Huynh MRN: [id number] Date of Birth: [birth year]  Today's Date: 02/16/2014 Time: 0945-1000 SLP Time Calculation (min): 15 min  Past Medical History:  Past Medical History  Diagnosis Date  . Diabetes mellitus without complication   . Hypertension   . COPD (chronic obstructive pulmonary disease)   . PVD (peripheral vascular disease)   . Acid reflux    Past Surgical History: History reviewed. No pertinent past surgical history. HPI:  Kayla Huynh is an 80 y.o. female who lives with her grandson who is her primary care giver.  Patient was brought to ED as a code stroke. On arrival she had a dense right right hemiplegia, right hemianopsia and right sided neglect.   Assessment / Plan / Recommendation Clinical Impression  BSE completed per stroke protocol.  PO's limited to ice chips due to significance of oral and  pharyngeal dysphagia.  Oral dysphagia marked by right-sided facial and labial weakness.  Poor oral awareness paired with reduced lingual coordination resulting in oral holding of bolus requiring SLP to remove ice chips.  Pharyngeal dysphagia marked mainly by severe delay in initiation of swallow.   Recommend continued NPO status as patient presenting with reduced ability to protect her airway fully.  ST to f/u on 02/17/14 to determine PO readiness.      Aspiration Risk  Severe    Diet Recommendation NPO   Medication Administration: Via alternative means    Other  Recommendations Oral Care Recommendations: Oral care Q4 per protocol   Follow Up Recommendations  Inpatient Rehab    Frequency and Duration min 2x/week  2 weeks       SLP Swallow Goals Please refer to Care Plans for Listed Goals   Swallow Study Prior Functional Status   Lives at home with caregiver.  No prior history of dysphagia    General Date of Onset: 02/15/14 HPI: Kayla Huynh is an 80 y.o. female who lives with her grandson who is her  primary caregiver. Type of Study: Bedside swallow evaluation Diet Prior to this Study: NPO Temperature Spikes Noted: No Respiratory Status: Room air History of Recent Intubation: No Behavior/Cognition: Alert;Cooperative;Decreased sustained attention;Hard of hearing;Distractible;Doesn't follow directions Oral Cavity - Dentition: Adequate natural dentition Self-Feeding Abilities: Total assist Patient Positioning: Upright in bed Baseline Vocal Quality: Clear Volitional Cough: Cognitively unable to elicit Volitional Swallow: Unable to elicit    Oral/Motor/Sensory Function Overall Oral Motor/Sensory Function: Impaired Labial ROM: Reduced right Labial Symmetry: Abnormal symmetry right Labial Strength: Reduced Labial Sensation: Reduced Lingual ROM: Reduced right Lingual Symmetry: Abnormal symmetry right Lingual Strength: Reduced Lingual Sensation: Reduced Facial ROM: Reduced right Facial Symmetry: Right droop Facial Strength: Reduced Facial Sensation: Reduced Velum: Within Functional Limits Mandible: Within Functional Limits   Ice Chips Ice chips: Impaired Oral Phase Impairments: Reduced labial seal;Reduced lingual movement/coordination;Impaired anterior to posterior transit;Impaired mastication;Poor awareness of bolus Pharyngeal Phase Impairments: Suspected delayed Swallow   Thin Liquid Thin Liquid: Not tested    Nectar Thick Nectar Thick Liquid: Not tested   Honey Thick Honey Thick Liquid: Not tested   Puree Puree: Not tested   Solid   GO    Solid: Not tested      Moreen FowlerKaren Kemesha Mosey M.S., CCC-SLP [phone] [facility] DanvilleDANKOF,Murna Backer 02/16/2014,10:08 AM

## 2014-02-16 NOTE — Progress Notes (Signed)
Stroke Team Progress Note  HISTORY Brylinn Teaney is an 80 y.o. female who lives with her grandson who is her primary care giver. At baseline she is fairly functional, able to wash and bath herself, walk with walker, able to feed herself and groom herself. She was last seen normal at 12:35 02/15/2014. When Grandson came back to check on her she was noted to be slumped over and not following commands. EMS was called and patient was brought to ED as a code stroke. On arrival she had a dense right right hemiplegia, right hemianopsia and right sided neglect. CT brain showed a probable hyperdense left MCA sign and early loss of gray-white discrimination in the insula and posterior frontal region on the left Discussion was had with family and family decided they would like tPA to be given as she is within the 3 hour window and functional at home. NIHSS: 22. Patient was administered TPA. She was admitted to the neuro ICU for further evaluation and treatment.  SUBJECTIVE Her daughter and son-in-law is at the bedside.  Her condition is unchanged from yesterday. And she remains aphasic with dense right hemiplegia and left gaze deviation. Her blood pressure has been tightly controlled. Repeat brain imaging is yet pending  OBJECTIVE Most recent Vital Signs: Filed Vitals:   02/16/14 0700 02/16/14 0800 02/16/14 0815 02/16/14 0842  BP: 148/50 152/52 152/52   Pulse: 56 58    Temp:    98.9 F (37.2 C)  TempSrc:    Axillary  Resp: 17 19    Height:      Weight:      SpO2: 98% 98% 99%    CBG (last 3)   Recent Labs  02/15/14 1458 02/15/14 2250 02/16/14 0840  GLUCAP 116* 123* 104*    IV Fluid Intake:   . sodium chloride 75 mL/hr at 02/16/14 0800    MEDICATIONS  .  stroke: mapping our early stages of recovery book   Does not apply Once  . antiseptic oral rinse  15 mL Mouth Rinse BID  . hydrALAZINE  10 mg Intravenous Once  . pantoprazole (PROTONIX) IV  40 mg Intravenous QHS   PRN:  acetaminophen,  acetaminophen, labetalol, senna-docusate  Diet:  NPO  Activity:  Bedrest DVT Prophylaxis:  SCDs   CLINICALLY SIGNIFICANT STUDIES Basic Metabolic Panel:   Recent Labs Lab 02/15/14 1446 02/15/14 1525  NA 144 143  K 4.3 4.2  CL 106 109  CO2 23  --   GLUCOSE 110* 107*  BUN 20 22  CREATININE 1.03 1.10  CALCIUM 8.9  --    Liver Function Tests:   Recent Labs Lab 02/15/14 1446  AST 12  ALT 7  ALKPHOS 57  BILITOT 0.2*  PROT 6.6  ALBUMIN 3.3*   CBC:   Recent Labs Lab 02/15/14 1446 02/15/14 1525  WBC 6.6  --   NEUTROABS 4.0  --   HGB 10.8* 12.2  HCT 34.4* 36.0  MCV 83.7  --   PLT 190  --    Coagulation:   Recent Labs Lab 02/15/14 1446  LABPROT 14.3  INR 1.11   Cardiac Enzymes: No results found for this basename: CKTOTAL, CKMB, CKMBINDEX, TROPONINI,  in the last 168 hours Urinalysis:   Recent Labs Lab 02/15/14 2215  COLORURINE YELLOW  LABSPEC 1.014  PHURINE 7.0  GLUCOSEU NEGATIVE  HGBUR NEGATIVE  BILIRUBINUR NEGATIVE  KETONESUR NEGATIVE  PROTEINUR 30*  UROBILINOGEN 1.0  NITRITE NEGATIVE  LEUKOCYTESUR NEGATIVE   Lipid Panel  Component Value Date/Time   CHOL 173 02/16/2014 0245   TRIG 189* 02/16/2014 0245   HDL 33* 02/16/2014 0245   CHOLHDL 5.2 02/16/2014 0245   VLDL 38 02/16/2014 0245   LDLCALC 102* 02/16/2014 0245   HgbA1C  No results found for this basename: HGBA1C    Urine Drug Screen:     Component Value Date/Time   LABOPIA NONE DETECTED 02/15/2014 2215   COCAINSCRNUR NONE DETECTED 02/15/2014 2215   LABBENZ NONE DETECTED 02/15/2014 2215   AMPHETMU NONE DETECTED 02/15/2014 2215   THCU NONE DETECTED 02/15/2014 2215   LABBARB NONE DETECTED 02/15/2014 2215    Alcohol Level:   Recent Labs Lab 02/15/14 1446  ETH <11     CT of the brain  02/15/2014    Atrophy and chronic small vessel ischemic changes.  Probable hyperdense left MCA and early loss of gray-white discrimination in the insula and posterior frontal region on the left. No  hemorrhage.   MRI of the brain    MRA of the brain    Carotid Doppler    2D Echocardiogram    CXR  02/15/2014   1. Cardiomegaly and trace bilateral effusions without definite evidence of edema. 2. Hyperexpanded lungs and bibasilar atelectasis without discrete focal airspace opacities to suggest pneumonia.   EKG  Sinus Rhythm with IVCD. Multiform ventricular  premature complexes. Nonspecific IVCD with LAD. For  complete results please see formal report.   Therapy Recommendations   Physical Exam   Frail elderly lady not in distress.Awake alert. Afebrile. Head is nontraumatic. Neck is supple without bruit. Hearing is normal. Cardiac exam no murmur or gallop. Lungs are clear to auscultation. Distal pulses are well felt. Neurological Exam : Drowsy but easily awakens. Left gaze deviation. Right gaze paralysis. Does not blink to threat on the right but does so on the left. Pupils 3 mm irregular reactive. Fundi were not visualized. Globally aphasic. Will speak a few words but nonsensical speech and cannot be understood. Right lower facial weakness. Tongue midline. Motor system exam reveals dense right hemiplegia with hypotonia and 0/5 right upper extremity strength. Mild withdrawal of the right lower extremity to painful stimuli. Purposeful antigravity strength on the left side. Right plantar upgoing left downgoing. Sensation appears to preserved bilaterally. Coordination and gait were not tested ASSESSMENT Ms. Kayloni Rocco is a 80 y.o. female presenting with acute onset of aphasiaf right hemiplegia, right hemianopsia. Status post IV t-PA 02/15/2014 at 1522. Patient with large L MCA infarct, confirmatory imaging pending but suspect large left MCA infarct with cytotoxic edema. Infarct felt to be  embolic secondary to known atrial fibrillation.  On aspirin 81 mg orally every day prior to admission. Now on no antiplatelets as within 24 h of tPA for secondary stroke prevention. Patient with resultant complete  global aphasia, right visual field cut, right hemiplegia, dysphagia. She is at risk for neurologic worsening, cerebral edema and poor outcome. Stroke work up underway.  atrial fibrillation, likely a new dx, no reported hx, not on anticoagulation prior to admission  Bradycardia, HR in 40s Hypertension, on norvasc at home, SBP 112-195 past 24h Hyperlipidemia, LDL 102, on no statin PTA, now on no statin as NPO, goal LDL < 70 for diabetics Diabetes, HgbA1c pending, goal < 7.0, on Glucophage at home, CBGs 104-123 GERD, on PPI as at home PVD  Dr. Pearlean Brownie discussed diagnosis, prognosis which is poor,  treatment options and plan of care with daughter and son in law at the bedside. Patient is unable  to participate. Patient will need long term placement and PEG for survival. They are considering DNR.    Hospital day # 1  TREATMENT/PLAN  Continue ICU level care given post tPA with strict blood pressure and neurological monitoring   and risk for neurologic worsening  Add aspirin 300 mg suppository for secondary stroke prevention if imaging at 24h negative for hemorrhage  F/u imaging to confirm stroke radiologically  F/u carotid, 2D, A1c  OOB, therapy evals  F/u with family; they are considering DNR and future plan of care  SIGNED Annie MainSHARON BIBY, MSN, RN, ANVP-BC, ANP-BC, GNP-BC [facility] Pager: [phone] 02/16/2014 9:24 AM I have personally examined this patient, reviewed notes, independently viewed imaging studies, participated in medical decision making and plan of care. I have made any additions or clarifications directly to the above note. Agree with note above. .Patient is at significant risk for neurological worsening and poor outcome  This patient is critically ill and at significant risk of neurological worsening, death and care requires constant monitoring of vital signs, hemodynamics,respiratory and cardiac monitoring,review of multiple databases, neurological assessment,  discussion with family, other specialists and medical decision making of high complexity.I have made any additions or clarifications directly to the above note.  I spent 35 minutes of neurocritical care time  in the care of  this patient. Delia HeadyPramod Sethi, MD Medical Director [facility] Pager: [phone] 02/16/2014 2:55 PM      To contact Stroke Continuity provider, please refer to [email or web address]. After hours, contact General Neurology

## 2014-02-16 NOTE — Progress Notes (Signed)
Nutrition Brief Note  Chart reviewed.  No nutrition issues prior to admission. Per RN, family is deciding on goals of care. Pt is unresponsive and unable to take PO per SLP notes.  No nutrition interventions warranted at this time.  Please re-consult as needed.   Ebbie LatusHaley Hawkins RD, LDN

## 2014-02-16 NOTE — Progress Notes (Signed)
UR completed.  Purvi Ruehl, RN BSN MHA CCM Trauma/Neuro ICU Case Manager [phone]  

## 2014-02-17 LAB — GLUCOSE, CAPILLARY
GLUCOSE-CAPILLARY: 102 mg/dL — AB (ref 70–99)
GLUCOSE-CAPILLARY: 88 mg/dL (ref 70–99)
Glucose-Capillary: 111 mg/dL — ABNORMAL HIGH (ref 70–99)
Glucose-Capillary: 107 mg/dL — ABNORMAL HIGH (ref 70–99)

## 2014-02-17 MED ORDER — HYDRALAZINE HCL 20 MG/ML IJ SOLN
10.0000 mg | INTRAMUSCULAR | Status: DC | PRN
  Administered 2014-02-17: 10 mg via INTRAVENOUS

## 2014-02-17 NOTE — Progress Notes (Addendum)
Stroke Team Progress Note  HISTORY Kayla Huynh is an 80 y.o. female who lives with her grandson who is her primary care giver. At baseline she is fairly functional, able to wash and bath herself, walk with walker, able to feed herself and groom herself. She was last seen normal at 12:35 02/15/2014. When Grandson came back to check on her she was noted to be slumped over and not following commands. EMS was called and patient was brought to ED as a code stroke. On arrival she had a dense right right hemiplegia, right hemianopsia and right sided neglect. CT brain showed a probable hyperdense left MCA sign and early loss of gray-white discrimination in the insula and posterior frontal region on the left Discussion was had with family and family decided they would like tPA to be given as she is within the 3 hour window and functional at home. NIHSS: 22. Patient was administered TPA. She was admitted to the neuro ICU for further evaluation and treatment.  SUBJECTIVE Resting comfortably. Family at bedside. Her condition is unchanged from yesterday. Repeat 24hr head CT shows large posterior L MCA nonhemorrhagic infarct and a hyperdense L MCA branch. Patient is currently NPO due to impaired swallow. Patient agitated overnight, BP trending up   Had extensive discussion with family in regards to code status. They wish for limited code with medication only, no chest compressions, no intubation.                                                                                                                                                                                                               OBJECTIVE Most recent Vital Signs: Filed Vitals:   02/17/14 0530 02/17/14 0600 02/17/14 0630 02/17/14 0700  BP: 176/67 187/62 171/64 174/60  Pulse: 56 59 57 56  Temp:      TempSrc:      Resp: 23 25 27 22   Height:      Weight:      SpO2: 98% 98% 99% 99%   CBG (last 3)   Recent Labs  02/16/14 1235 02/16/14 1721  02/16/14 2215  GLUCAP 110* 130* 101*    IV Fluid Intake:   . sodium chloride 1,000 mL (02/16/14 2216)    MEDICATIONS  .  stroke: mapping our early stages of recovery book   Does not apply Once  . antiseptic oral rinse  15 mL Mouth Rinse BID  . hydrALAZINE  10 mg Intravenous Once  . pantoprazole (PROTONIX) IV  40 mg Intravenous QHS  PRN:  acetaminophen, acetaminophen, hydrALAZINE, senna-docusate  Diet:  NPO  Activity:  Bedrest DVT Prophylaxis:  SCDs   CLINICALLY SIGNIFICANT STUDIES Basic Metabolic Panel:   Recent Labs Lab 02/15/14 1446 02/15/14 1525  NA 144 143  K 4.3 4.2  CL 106 109  CO2 23  --   GLUCOSE 110* 107*  BUN 20 22  CREATININE 1.03 1.10  CALCIUM 8.9  --    Liver Function Tests:   Recent Labs Lab 02/15/14 1446  AST 12  ALT 7  ALKPHOS 57  BILITOT 0.2*  PROT 6.6  ALBUMIN 3.3*   CBC:   Recent Labs Lab 02/15/14 1446 02/15/14 1525  WBC 6.6  --   NEUTROABS 4.0  --   HGB 10.8* 12.2  HCT 34.4* 36.0  MCV 83.7  --   PLT 190  --    Coagulation:   Recent Labs Lab 02/15/14 1446  LABPROT 14.3  INR 1.11   Cardiac Enzymes: No results found for this basename: CKTOTAL, CKMB, CKMBINDEX, TROPONINI,  in the last 168 hours Urinalysis:   Recent Labs Lab 02/15/14 2215  COLORURINE YELLOW  LABSPEC 1.014  PHURINE 7.0  GLUCOSEU NEGATIVE  HGBUR NEGATIVE  BILIRUBINUR NEGATIVE  KETONESUR NEGATIVE  PROTEINUR 30*  UROBILINOGEN 1.0  NITRITE NEGATIVE  LEUKOCYTESUR NEGATIVE   Lipid Panel    Component Value Date/Time   CHOL 173 02/16/2014 0245   TRIG 189* 02/16/2014 0245   HDL 33* 02/16/2014 0245   CHOLHDL 5.2 02/16/2014 0245   VLDL 38 02/16/2014 0245   LDLCALC 102* 02/16/2014 0245   HgbA1C  Lab Results  Component Value Date   HGBA1C 6.1* 02/16/2014    Urine Drug Screen:     Component Value Date/Time   LABOPIA NONE DETECTED 02/15/2014 2215   COCAINSCRNUR NONE DETECTED 02/15/2014 2215   LABBENZ NONE DETECTED 02/15/2014 2215   AMPHETMU NONE  DETECTED 02/15/2014 2215   THCU NONE DETECTED 02/15/2014 2215   LABBARB NONE DETECTED 02/15/2014 2215    Alcohol Level:   Recent Labs Lab 02/15/14 1446  ETH <11     CT of the brain  02/15/2014    Atrophy and chronic small vessel ischemic changes.  Probable hyperdense left MCA and early loss of gray-white discrimination in the insula and posterior frontal region on the left. No hemorrhage.                                                                                                                                                                                                          MRI of  the brain    MRA of the brain    Carotid Doppler    2D Echocardiogram    CXR  02/15/2014   1. Cardiomegaly and trace bilateral effusions without definite evidence of edema. 2. Hyperexpanded lungs and bibasilar atelectasis without discrete focal airspace opacities to suggest pneumonia.   EKG  Sinus Rhythm with IVCD. Multiform ventricular  premature complexes. Nonspecific IVCD with LAD. For  complete results please see formal report.   Therapy Recommendations   Physical Exam   Frail elderly lady not in distress.Awake alert. Afebrile. Head is nontraumatic. Neck is supple without bruit. Hearing is normal. Cardiac exam no murmur or gallop. Lungs are clear to auscultation. Distal pulses are well felt. Neurological Exam : Drowsy but easily awakens. Left gaze deviation. Right gaze paralysis. Does not blink to threat on the right but does so on the left. Pupils 3 mm irregular reactive. Fundi were not visualized. No verbal output, minimally followed commands (attempted to smile when asked but no other commands followed). Right lower facial weakness. Tongue midline. Motor system exam reveals dense right hemiplegia with hypotonia and 0/5 right upper extremity strength. Mild withdrawal of the right lower extremity to painful stimuli. Purposeful antigravity strength on the left side. Right plantar upgoing left  downgoing. Sensation appears to preserved bilaterally. Coordination and gait were not tested ASSESSMENT Ms. Kayla Huynh is a 80 y.o. female presenting with acute onset of aphasiaf right hemiplegia, right hemianopsia. Status post IV t-PA 02/15/2014 at 1522. Patient with large L MCA infarct, confirmed on head CT. Infarct felt to be  embolic secondary to known atrial fibrillation.  On aspirin 81 mg orally every day prior to admission. Now on ASA 300mg  PR for secondary stroke prevention. Patient with resultant complete global aphasia, right visual field cut, right hemiplegia, dysphagia. She is at risk for neurologic worsening, cerebral edema and poor outcome. Stroke work up underway.  atrial fibrillation, likely a new dx, no reported hx, not on anticoagulation prior to admission  Bradycardia, HR in 40s Hypertension, on norvasc at home Hyperlipidemia, LDL 102, on no statin PTA, now on no statin as NPO, goal LDL < 70 for diabetics Diabetes, HgbA1c pending, goal < 7.0, on Glucophage at home, CBGs 104-123 GERD, on PPI as at home PVD  Discussed diagnosis, prognosis which is poor,  treatment options and plan of care with daughters at the bedside. Patient is unable to participate. Patient will need long term placement and PEG for survival. Decision made for limited code with medication only, no compressions or intubation.  Hospital day # 2  TREATMENT/PLAN  Continue ICU level care given post tPA with strict blood pressure and neurological monitoring  and risk for neurologic worsening  Continue  aspirin 300 mg suppository for secondary stroke prevention  Hydralazine prn for hypertension. Bradycardia down to the 40s with labetalol  Hold off on MRI due to agitation  F/u carotid, 2D, A1c  OOB, therapy evals  SLT to follow up today, may need NGT for feeding and medication  SIGNED  02/17/2014 8:19 AM  This patient is critically ill and at significant risk of neurological worsening, death and care  requires constant monitoring of vital signs, hemodynamics,respiratory and cardiac monitoring,review of multiple databases, neurological assessment, discussion with family, other specialists and medical decision making of high complexity.I have made any additions or clarifications directly to the above note. A total of 35 minutes was spent with the patient.   Elspeth Cho, DO [facility] Pager: [phone]     To  contact Stroke Continuity provider, please refer to [email or web address]. After hours, contact General Neurology

## 2014-02-17 NOTE — Progress Notes (Signed)
SLP Cancellation Note  Patient Details Name: Kayla Huynh MRN: [id number] DOB: May 19, 1914   Cancelled treatment: Attempt x1 to complete swallow treatment to determine readiness to initiate PO's.  Unable to administer PO's secondary to patient presenting with lethargy decreasing ability to participate fully.  RN to page SLP if LOA improves this date.  ST to continue in acute care.   Moreen FowlerKaren Missouri Lapaglia MS, CCC-SLP [phone] [facility] Beverly[facility]ANKOF,Carless Slatten 02/17/2014, 1:14 PM

## 2014-02-17 NOTE — Progress Notes (Signed)
*  PRELIMINARY RESULTS* Vascular Ultrasound Carotid Duplex (Doppler) has been completed.   Study was technically difficult due to poor patient compliance. Findings suggest 1-39% internal carotid artery stenosis bilaterally. Unable to visualize bilateral vertebral arteries due to technical limitations.  02/17/2014 1:45 PM Gertie FeyMichelle Kirk Basquez, RVT, RDCS, RDMS

## 2014-02-18 LAB — GLUCOSE, CAPILLARY
Glucose-Capillary: 90 mg/dL (ref 70–99)
Glucose-Capillary: 88 mg/dL (ref 70–99)
Glucose-Capillary: 77 mg/dL (ref 70–99)

## 2014-02-18 MED ORDER — CHLORHEXIDINE GLUCONATE 0.12 % MT SOLN
15.0000 mL | Freq: Two times a day (BID) | OROMUCOSAL | Status: DC
  Administered 2014-02-18 – 2014-02-22 (×9): 15 mL via OROMUCOSAL
  Filled 2014-02-18 (×6): qty 15

## 2014-02-18 MED ORDER — BIOTENE DRY MOUTH MT LIQD
15.0000 mL | Freq: Two times a day (BID) | OROMUCOSAL | Status: DC
  Administered 2014-02-18 – 2014-02-22 (×9): 15 mL via OROMUCOSAL

## 2014-02-18 NOTE — Progress Notes (Signed)
Speech Language Pathology Treatment: Dysphagia  Patient Details Name: Scarlette ShortsLouise Dowda MRN: [id number] DOB: September 29, 1913 Today's Date: 02/18/2014 Time: [id number] SLP Time Calculation (min): 15 min  Assessment / Plan / Recommendation Clinical Impression  Swallow treatment completed to determine PO readiness.  Continues with severe oral and suspected pharyngeal dysphagia.  Unable to coordinate swallow sequence to propel bolus posteriorly.  Bolus eventually suctioned from lingual area.  Recommend to continue NPO status as patient continues to present with decreased ability to protect airway.  ST to f/u on 02/19/14.   HPI HPI: 80 y/o female who lives with her grandson who is her primary care giver.  Patient brought to ED as code stroke.  Dense right hemiplegia, right heminanopsia, and right sided neglect.       SLP Plan  Continue with current plan of care    Recommendations Diet recommendations: NPO Medication Administration: Via alternative means              General recommendations: Rehab consult Oral Care Recommendations: Oral care Q4 per protocol Follow up Recommendations: Inpatient Rehab Plan: Continue with current plan of care    GO    Moreen FowlerKaren Jayley Hustead MS, CCC-SLP [phone] [facility],Leeam Cedrone 02/18/2014, 12:40 PM

## 2014-02-18 NOTE — Evaluation (Signed)
Speech Language Pathology Evaluation Patient Details Name: Kayla Huynh MRN: [id number] DOB: 21-Feb-1914 Today's Date: 02/18/2014 Time: [id number] SLP Time Calculation (min): 15 min  Problem List:  Patient Active Problem List   Diagnosis Date Noted  . Cerebral embolism with cerebral infarction 02/15/2014   Past Medical History:  Past Medical History  Diagnosis Date  . Diabetes mellitus without complication   . Hypertension   . COPD (chronic obstructive pulmonary disease)   . PVD (peripheral vascular disease)   . Acid reflux    Past Surgical History: History reviewed. No pertinent past surgical history. HPI:  Kayla Huynh is an 80 y.o. female who lives with her grandson who is her primary care giver.  Patient was brought to ED as a code stroke. On arrival she had a dense right right hemiplegia, right hemianopsia and right sided neglect.    Assessment / Plan / Recommendation Clinical Impression  Cognitive Linguistic Evaluation completed per Stroke Protocol.  Cognitive assessment limited due to significance of expressive aphasia.    Skilled ST indicated to address communication deficits and to further assess cognition.  Recommend continued ST at next level of care.      SLP Assessment  Patient needs continued Speech Lanaguage Pathology Services    Follow Up Recommendations  Inpatient Rehab    Frequency and Duration min 2x/week  2 weeks      SLP Goals  SLP Goals Potential Considerations: Severity of impairments  SLP Evaluation Prior Functioning  Cognitive/Linguistic Baseline: Within functional limits Type of Home: House  Lives With: Family Available Help at Discharge: Family;Available PRN/intermittently Vocation: Retired   IT consultantCognition  Overall Cognitive Status: Impaired/Different from baseline Arousal/Alertness: Lethargic Orientation Level: Other (comment) (aphasic UTA) Attention: Sustained Sustained Attention: Impaired Sustained Attention Impairment: Functional  basic Problem Solving: Impaired Problem Solving Impairment: Functional basic Executive Function: Initiating    Comprehension  Auditory Comprehension Overall Auditory Comprehension: Impaired Yes/No Questions: Within Functional Limits Commands: Impaired One Step Basic Commands: 0-24% accurate Two Step Basic Commands: 0-24% accurate Interfering Components: Attention;Hearing;Visual impairments Visual Recognition/Discrimination Discrimination: Exceptions to [facility] HospitalWFL Common Objects: Unable to indentify Reading Comprehension Reading Status: Not tested    Expression Expression Primary Mode of Expression: Nonverbal - gestures Verbal Expression Overall Verbal Expression: Impaired Initiation: Impaired Automatic Speech: Name;Social Response;Counting;Day of week Level of Generative/Spontaneous Verbalization: Word Repetition: Impaired Level of Impairment: Word level Naming: Impairment Responsive: 0-25% accurate Confrontation: Impaired Common Objects: Unable to indentify Verbal Errors: Phonemic paraphasias;Semantic paraphasias;Neologisms;Jargon Pragmatics: No impairment Interfering Components: Attention Written Expression Dominant Hand: Right Written Expression: Not tested   Oral / Motor Oral Motor/Sensory Function Overall Oral Motor/Sensory Function: Impaired Labial ROM: Reduced right Labial Symmetry: Abnormal symmetry right Labial Strength: Reduced Labial Sensation: Reduced Lingual ROM: Reduced right Lingual Symmetry: Abnormal symmetry right Lingual Strength: Reduced Lingual Sensation: Reduced Facial ROM: Reduced right Facial Symmetry: Right droop Facial Strength: Reduced Facial Sensation: Reduced Velum: Impaired right;Within Functional Limits Mandible: Within Functional Limits Motor Speech Overall Motor Speech: Impaired Articulation: Impaired Level of Impairment: Word Intelligibility: Intelligibility reduced Word: 0-24% accurate Phrase: 0-24% accurate Sentence: 0-24%  accurate   GO    Moreen FowlerKaren Clete Kuch MS, CCC-SLP [phone] [facility] CenterDANKOF,Kawana Hegel 02/18/2014, 12:33 PM

## 2014-02-18 NOTE — Progress Notes (Addendum)
Stroke Team Progress Note  HISTORY Kayla Huynh is an 80 y.o. female who lives with her grandson who is her primary care giver. At baseline she is fairly functional, able to wash and bath herself, walk with walker, able to feed herself and groom herself. She was last seen normal at 12:35 02/15/2014. When Grandson came back to check on her she was noted to be slumped over and not following commands. EMS was called and patient was brought to ED as a code stroke. On arrival she had a dense right right hemiplegia, right hemianopsia and right sided neglect. CT brain showed a probable hyperdense left MCA sign and early loss of gray-white discrimination in the insula and posterior frontal region on the left Discussion was had with family and family decided they would like tPA to be given as she is within the 3 hour window and functional at home. NIHSS: 22. Patient was administered TPA. She was admitted to the neuro ICU for further evaluation and treatment.  SUBJECTIVE Resting comfortably. Family at bedside. Her condition is unchanged from yesterday. Unable to complete swallow study due to lethargy. Remains NPO              OBJECTIVE Most recent Vital Signs: Filed Vitals:   02/18/14 0419 02/18/14 0500 02/18/14 0600 02/18/14 0700  BP:  122/53 110/83 112/81  Pulse:  54 55 75  Temp: 97.9 F (36.6 C)     TempSrc: Axillary     Resp:  22 21 23   Height:      Weight:      SpO2:  99% 99% 97%   CBG (last 3)   Recent Labs  02/17/14 1240 02/17/14 1618 02/17/14 2228  GLUCAP 102* 88 107*    IV Fluid Intake:   . sodium chloride 1,000 mL (02/18/14 0210)    MEDICATIONS  .  stroke: mapping our early stages of recovery book   Does not apply Once  . antiseptic oral rinse  15 mL Mouth Rinse BID  . hydrALAZINE  10 mg Intravenous Once  . pantoprazole (PROTONIX) IV  40 mg Intravenous QHS   PRN:  acetaminophen, acetaminophen, hydrALAZINE, senna-docusate  Diet:  NPO  Activity:  Bedrest DVT Prophylaxis:   SCDs   CLINICALLY SIGNIFICANT STUDIES Basic Metabolic Panel:   Recent Labs Lab 02/15/14 1446 02/15/14 1525  NA 144 143  K 4.3 4.2  CL 106 109  CO2 23  --   GLUCOSE 110* 107*  BUN 20 22  CREATININE 1.03 1.10  CALCIUM 8.9  --    Liver Function Tests:   Recent Labs Lab 02/15/14 1446  AST 12  ALT 7  ALKPHOS 57  BILITOT 0.2*  PROT 6.6  ALBUMIN 3.3*   CBC:   Recent Labs Lab 02/15/14 1446 02/15/14 1525  WBC 6.6  --   NEUTROABS 4.0  --   HGB 10.8* 12.2  HCT 34.4* 36.0  MCV 83.7  --   PLT 190  --    Coagulation:   Recent Labs Lab 02/15/14 1446  LABPROT 14.3  INR 1.11   Cardiac Enzymes: No results found for this basename: CKTOTAL, CKMB, CKMBINDEX, TROPONINI,  in the last 168 hours Urinalysis:   Recent Labs Lab 02/15/14 2215  COLORURINE YELLOW  LABSPEC 1.014  PHURINE 7.0  GLUCOSEU NEGATIVE  HGBUR NEGATIVE  BILIRUBINUR NEGATIVE  KETONESUR NEGATIVE  PROTEINUR 30*  UROBILINOGEN 1.0  NITRITE NEGATIVE  LEUKOCYTESUR NEGATIVE   Lipid Panel    Component Value Date/Time   CHOL 173  02/16/2014 0245   TRIG 189* 02/16/2014 0245   HDL 33* 02/16/2014 0245   CHOLHDL 5.2 02/16/2014 0245   VLDL 38 02/16/2014 0245   LDLCALC 102* 02/16/2014 0245   HgbA1C  Lab Results  Component Value Date   HGBA1C 6.1* 02/16/2014    Urine Drug Screen:     Component Value Date/Time   LABOPIA NONE DETECTED 02/15/2014 2215   COCAINSCRNUR NONE DETECTED 02/15/2014 2215   LABBENZ NONE DETECTED 02/15/2014 2215   AMPHETMU NONE DETECTED 02/15/2014 2215   THCU NONE DETECTED 02/15/2014 2215   LABBARB NONE DETECTED 02/15/2014 2215    Alcohol Level:   Recent Labs Lab 02/15/14 1446  ETH <11     CT of the brain  02/15/2014    Atrophy and chronic small vessel ischemic changes.  Probable hyperdense left MCA and early loss of gray-white discrimination in the insula and posterior frontal region on the left. No hemorrhage.                                                                                                                                                                                                           MRI of the brain    MRA of the brain    Carotid Doppler    2D Echocardiogram    CXR  02/15/2014   1. Cardiomegaly and trace bilateral effusions without definite evidence of edema. 2. Hyperexpanded lungs and bibasilar atelectasis without discrete focal airspace opacities to suggest pneumonia.   EKG  Sinus Rhythm with IVCD. Multiform ventricular  premature complexes. Nonspecific IVCD with LAD. For  complete results please see formal report.   Therapy Recommendations   Physical Exam   Frail elderly lady not in distress.Awake alert. Afebrile. Head is nontraumatic. Neck is supple without bruit. Hearing is normal. Cardiac exam no murmur or gallop. Lungs are clear to auscultation. Distal pulses are well felt. Neurological Exam : Drowsy but easily awakens. Left gaze deviation. Right gaze paralysis. Does not blink to threat on the right but does so on the left. Pupils 3 mm irregular reactive. Fundi were not visualized. No verbal output, minimally followed commands (attempted to smile when asked, wiggled left fingers and toes but no other commands followed). Right lower facial weakness. Tongue midline. Motor system exam reveals dense right hemiplegia with hypotonia and 0/5 right upper extremity strength. Mild withdrawal of the right lower extremity to painful stimuli. Purposeful antigravity strength on the left side. Right plantar upgoing left downgoing. Sensation appears to preserved bilaterally. Coordination and gait were not tested ASSESSMENT Kayla Huynh is a 80 y.o. female presenting with acute onset of aphasiaf right hemiplegia, right hemianopsia. Status post IV t-PA 02/15/2014 at 1522. Patient with large L MCA infarct, confirmed on head CT. Infarct felt to be  embolic secondary to known atrial fibrillation.  On aspirin 81 mg orally every day prior to admission. Now on ASA  300mg  PR for secondary stroke prevention. Patient with resultant complete global aphasia, right visual field cut, right hemiplegia, dysphagia. She is at risk for neurologic worsening, cerebral edema and poor outcome. Stroke work up underway.  atrial fibrillation, likely a new dx, no reported hx, not on anticoagulation prior to admission  Bradycardia, HR in 40s Hypertension, on norvasc at home Hyperlipidemia, LDL 102, on no statin PTA, now on no statin as NPO, goal LDL < 70 for diabetics Diabetes, HgbA1c pending, goal < 7.0, on Glucophage at home, CBGs 104-123 GERD, on PPI as at home PVD  Discussed diagnosis, prognosis which is poor,  treatment options and plan of care with daughters at the bedside. Patient is unable to participate. Patient will need long term placement and PEG for survival. Decision made for limited code with medication only, no compressions or intubation.  Hospital day # 3  TREATMENT/PLAN  Continue ICU level care given post tPA with strict blood pressure and neurological monitoring  and risk for neurologic worsening  Continue  aspirin 300 mg suppository for secondary stroke prevention  Hydralazine prn for hypertension. Bradycardia down to the 40s with labetalol  Hold off on MRI due to agitation  F/u carotid, 2D, A1c  Statin held as patient NPO  Rehab following, suggest SNF  Remains NPO, speech will re-evaluate  Will plan to transfer to 4N today  SIGNED  02/18/2014 8:27 AM  Elspeth Cho, DO [facility] Pager: [phone]     To contact Stroke Continuity provider, please refer to [email or web address]. After hours, contact General Neurology

## 2014-02-18 NOTE — Progress Notes (Signed)
Patient has been on 4N approx 1 hour, has twice brady'd into the 30s before jumping back into the 50s. One 2 sec pause noted as patient transitioned from a-fib to sinus. Also freq wide complex PVCs noted by monitor tech.  Md on call paged with this info, Kayla Huynh, no further orders at this time, will cont to monitor

## 2014-02-19 ENCOUNTER — Inpatient Hospital Stay (HOSPITAL_COMMUNITY): Payer: Medicare Other

## 2014-02-19 DIAGNOSIS — E440 Moderate protein-calorie malnutrition: Secondary | ICD-10-CM | POA: Diagnosis present

## 2014-02-19 LAB — CBC WITH DIFFERENTIAL/PLATELET
BASOS PCT: 0 % (ref 0–1)
Basophils Absolute: 0 10*3/uL (ref 0.0–0.1)
Eosinophils Absolute: 0.1 10*3/uL (ref 0.0–0.7)
Eosinophils Relative: 2 % (ref 0–5)
HCT: 32.7 % — ABNORMAL LOW (ref 36.0–46.0)
HEMOGLOBIN: 10.4 g/dL — AB (ref 12.0–15.0)
Lymphocytes Relative: 18 % (ref 12–46)
Lymphs Abs: 1.4 10*3/uL (ref 0.7–4.0)
MCH: 26.2 pg (ref 26.0–34.0)
MCHC: 31.8 g/dL (ref 30.0–36.0)
MCV: 82.4 fL (ref 78.0–100.0)
MONOS PCT: 9 % (ref 3–12)
Monocytes Absolute: 0.7 10*3/uL (ref 0.1–1.0)
NEUTROS ABS: 5.4 10*3/uL (ref 1.7–7.7)
NEUTROS PCT: 71 % (ref 43–77)
Platelets: 201 10*3/uL (ref 150–400)
RBC: 3.97 MIL/uL (ref 3.87–5.11)
RDW: 14.4 % (ref 11.5–15.5)
WBC: 7.6 10*3/uL (ref 4.0–10.5)

## 2014-02-19 LAB — TSH: TSH: 1.53 u[IU]/mL (ref 0.350–4.500)

## 2014-02-19 LAB — BASIC METABOLIC PANEL
Anion gap: 19 — ABNORMAL HIGH (ref 5–15)
BUN: 14 mg/dL (ref 6–23)
CO2: 16 mEq/L — ABNORMAL LOW (ref 19–32)
Calcium: 8.2 mg/dL — ABNORMAL LOW (ref 8.4–10.5)
Chloride: 106 mEq/L (ref 96–112)
Creatinine, Ser: 0.72 mg/dL (ref 0.50–1.10)
GFR calc non Af Amer: 70 mL/min — ABNORMAL LOW (ref >90)
GFR, EST AFRICAN AMERICAN: 81 mL/min — AB (ref >90)
Glucose, Bld: 70 mg/dL (ref 70–99)
POTASSIUM: 3.4 meq/L — AB (ref 3.7–5.3)
Sodium: 141 mEq/L (ref 137–147)

## 2014-02-19 LAB — GLUCOSE, CAPILLARY
GLUCOSE-CAPILLARY: 121 mg/dL — AB (ref 70–99)
Glucose-Capillary: 75 mg/dL (ref 70–99)
Glucose-Capillary: 77 mg/dL (ref 70–99)
Glucose-Capillary: 73 mg/dL (ref 70–99)
Glucose-Capillary: 93 mg/dL (ref 70–99)
Glucose-Capillary: 94 mg/dL (ref 70–99)

## 2014-02-19 LAB — T4, FREE: FREE T4: 1.14 ng/dL (ref 0.80–1.80)

## 2014-02-19 LAB — FOLATE: Folate: >20 ng/mL

## 2014-02-19 LAB — VITAMIN B12: Vitamin B-12: 610 pg/mL (ref 211–911)

## 2014-02-19 MED ORDER — ATORVASTATIN CALCIUM 40 MG PO TABS
40.0000 mg | ORAL_TABLET | Freq: Every day | ORAL | Status: DC
  Administered 2014-02-20 – 2014-02-21 (×2): 40 mg via ORAL
  Filled 2014-02-19 (×2): qty 1

## 2014-02-19 MED ORDER — HEPARIN SODIUM (PORCINE) 5000 UNIT/ML IJ SOLN
5000.0000 [IU] | Freq: Three times a day (TID) | INTRAMUSCULAR | Status: DC
  Administered 2014-02-19 – 2014-02-22 (×9): 5000 [IU] via SUBCUTANEOUS
  Filled 2014-02-19 (×9): qty 1

## 2014-02-19 MED ORDER — IOHEXOL 300 MG/ML  SOLN
50.0000 mL | Freq: Once | INTRAMUSCULAR | Status: AC | PRN
Start: 2014-02-19 — End: 2014-02-19
  Administered 2014-02-19: 30 mL via ORAL

## 2014-02-19 MED ORDER — PANTOPRAZOLE SODIUM 40 MG PO PACK
40.0000 mg | PACK | Freq: Every day | ORAL | Status: DC
  Administered 2014-02-20 – 2014-02-22 (×2): 40 mg
  Filled 2014-02-19 (×4): qty 20

## 2014-02-19 MED ORDER — IOHEXOL 300 MG/ML  SOLN: 50.0000 mL | Freq: Once | INTRAMUSCULAR | Status: DC | PRN

## 2014-02-19 MED ORDER — POTASSIUM CHLORIDE 20 MEQ/15ML (10%) PO LIQD
40.0000 meq | ORAL | Status: AC
  Administered 2014-02-19 – 2014-02-20 (×2): 40 meq
  Filled 2014-02-19 (×2): qty 30

## 2014-02-19 MED ORDER — POTASSIUM CHLORIDE CRYS ER 20 MEQ PO TBCR: 40.0000 meq | EXTENDED_RELEASE_TABLET | ORAL | Status: DC

## 2014-02-19 MED ORDER — JEVITY 1.2 CAL PO LIQD
1000.0000 mL | ORAL | Status: DC
  Administered 2014-02-19 – 2014-02-20 (×2)
  Filled 2014-02-19: qty 237
  Filled 2014-02-19 (×2): qty 1000
  Filled 2014-02-19: qty 237
  Filled 2014-02-19 (×4): qty 1000

## 2014-02-19 MED ORDER — DEXTROSE 50 % IV SOLN
25.0000 mL | Freq: Once | INTRAVENOUS | Status: AC
  Administered 2014-02-19: 25 mL via INTRAVENOUS
  Filled 2014-02-19: qty 50

## 2014-02-19 MED ORDER — PANTOPRAZOLE SODIUM 40 MG PO TBEC: 40.0000 mg | DELAYED_RELEASE_TABLET | Freq: Every day | ORAL | Status: DC

## 2014-02-19 MED ORDER — ACETAMINOPHEN 325 MG PO TABS: 650.0000 mg | ORAL_TABLET | Freq: Four times a day (QID) | ORAL | Status: DC | PRN

## 2014-02-19 MED ORDER — ASPIRIN 300 MG RE SUPP
300.0000 mg | Freq: Every day | RECTAL | Status: DC
  Administered 2014-02-19: 300 mg via RECTAL
  Filled 2014-02-19: qty 1

## 2014-02-19 MED ORDER — ASPIRIN 325 MG PO TABS
325.0000 mg | ORAL_TABLET | Freq: Every day | ORAL | Status: DC
  Administered 2014-02-20 – 2014-02-22 (×2): 325 mg via ORAL
  Filled 2014-02-19 (×2): qty 1

## 2014-02-19 MED ORDER — ASPIRIN 300 MG RE SUPP: 300.0000 mg | Freq: Every day | RECTAL | Status: DC

## 2014-02-19 NOTE — Progress Notes (Signed)
UR COMPLETED  

## 2014-02-19 NOTE — Progress Notes (Signed)
Pt in A fib currently, having PVCs in the am CV strip, 5 beat run of wide complex PVCs, strip saved in chart review.  Pt CBG 73 this am.  MD paged.  Will monitor.

## 2014-02-19 NOTE — Progress Notes (Signed)
INITIAL NUTRITION ASSESSMENT  DOCUMENTATION CODES Per approved criteria  -Non-severe (moderate) malnutrition in the context of chronic illness  Pt meets criteria for moderate MALNUTRITION in the context of chronic illness as evidenced by moderate fat and muscle wasting.  INTERVENTION: Initiate Jevity 1.2 @ 20 ml/hr via NG tube and increase by 10 ml every 6 hours to goal rate of 55 ml/hr.   Tube feeding regimen provides 1584 kcal (100% of needs), 73 grams of protein, and 1069 ml of H2O.   Recommend free water flushes: 200 ml every 8 hours to provide additional 600 ml of fluid  NUTRITION DIAGNOSIS: Inadequate oral intake related to inability to eat as evidenced by NPO.   Goal: Pt to meet >/= 90% of their estimated nutrition needs   Monitor:  TF initiation and tolerance, weight trends, labs  Reason for Assessment: Consult for TF initiation and management  80 y.o. female  Admitting Dx: <principal problem not specified>  ASSESSMENT: 80 y.o. female who lives with her grandson who is her primary care giver. At baseline she is fairly functional, able to wash and bath herself, walk with walker, able to feed herself and groom herself. She was last seen normal at 12:35. When Grandson came back to check on her she was noted to be slumped over and not following commands. EMS was called and patient was brought to ED as a code stroke.   - Pt seen by SLP who recommended NPO due to severe risk of aspiration.  Nutrition Focused Physical Exam:  Subcutaneous Fat:  Orbital Region: moderate wasting Upper Arm Region: moderate wasting Thoracic and Lumbar Region: moderate wasting  Muscle:  Temple Region: moderate wasting Clavicle Bone Region: moderate wasting Clavicle and Acromion Bone Region: moderate wasting Scapular Bone Region: moderate wasting Dorsal Hand: moderate wasting Patellar Region: moderate wasting Anterior Thigh Region: WNL Posterior Calf Region: WNL  Edema:  none  Height: Ht Readings from Last 1 Encounters:  02/15/14 5\' 4"  (1.626 m)    Weight: Wt Readings from Last 1 Encounters:  02/15/14 128 lb 15.5 oz (58.5 kg)    Ideal Body Weight: 54.7 kg  % Ideal Body Weight: 107%  Wt Readings from Last 10 Encounters:  02/15/14 128 lb 15.5 oz (58.5 kg)    Usual Body Weight: unknown  % Usual Body Weight: unknown  BMI:  Body mass index is 22.13 kg/(m^2).  Estimated Nutritional Needs: Kcal: 1550-1700 Protein: 70-80 g Fluid: ~1.7 L/day  Skin: stage II pressure ulcer on buttocks  Diet Order: NPO  EDUCATION NEEDS: -No education needs identified at this time  No intake or output data in the 24 hours ending 02/19/14 1436  Last BM: none recorded   Labs:   Recent Labs Lab 02/15/14 1446 02/15/14 1525 02/19/14 0607  NA 144 143 141  K 4.3 4.2 3.4*  CL 106 109 106  CO2 23  --  16*  BUN 20 22 14   CREATININE 1.03 1.10 0.72  CALCIUM 8.9  --  8.2*  GLUCOSE 110* 107* 70    CBG (last 3)   Recent Labs  02/19/14 0411 02/19/14 0835 02/19/14 1116  GLUCAP 75 73 121*    Scheduled Meds: .  stroke: mapping our early stages of recovery book   Does not apply Once  . antiseptic oral rinse  15 mL Mouth Rinse BID  . antiseptic oral rinse  15 mL Mouth Rinse q12n4p  . aspirin  300 mg Rectal Daily  . chlorhexidine  15 mL Mouth Rinse BID  .  pantoprazole (PROTONIX) IV  40 mg Intravenous QHS    Continuous Infusions: . sodium chloride 75 mL/hr at 02/19/14 0981    Past Medical History  Diagnosis Date  . Diabetes mellitus without complication   . Hypertension   . COPD (chronic obstructive pulmonary disease)   . PVD (peripheral vascular disease)   . Acid reflux     History reviewed. No pertinent past surgical history.  Ebbie Latus RD, LDN

## 2014-02-19 NOTE — Progress Notes (Signed)
Jevity increased to a rate of 30 cc/hr. No residual noted. Pt tolerating tube feeding at this time. Will continue to monitor. Herma ArdMesser, Camri Molloy RN BSN

## 2014-02-19 NOTE — Progress Notes (Addendum)
Speech Language Pathology Treatment: Dysphagia  Patient Details Name: Kayla Huynh MRN: [id number] DOB: 1913/10/30 Today's Date: 02/19/2014 Time: 1050-1110 SLP Time Calculation (min): 20 min  Assessment / Plan / Recommendation Clinical Impression  Dysphagia treatment provided today- PO trials, family education. Pt reportedly more alert today- appeared agitated and unable to verbalize complaints. Oral care with suction provided prior to PO intake. Pt able to initiate swallow today with ice chips and 1 bite of applesauce. Weak cough followed puree trial. Significant oral difficulties noted- anterior loss of bolus and decreased lingual movement, along with suspect delayed pharyngeal swallow initiation. Given these findings, pt continues to be at severe risk of aspiration, and recommend that pt continue to be NPO with consideration of alternative means of nutrition. However, pt should be able to tolerate ice chips following thorough oral care if alert and upright, monitoring closely for swallow initiation and have suction available during these trials. Educated daughter and RN of findings and recommendations. ST will continue to follow for PO trials.    HPI HPI: 80 y/o female who lives with her grandson who is her primary care giver.  Patient brought to ED as code stroke.  Dense right hemiplegia, right heminanopsia, and right sided neglect.    Pertinent Vitals n/a  SLP Plan  Continue with current plan of care    Recommendations Diet recommendations: NPO Medication Administration: Via alternative means Postural Changes and/or Swallow Maneuvers: Seated upright 90 degrees (for ice chips following oral care)              General recommendations: Rehab consult Oral Care Recommendations: Oral care Q4 per protocol;Oral care prior to ice chips Follow up Recommendations: Inpatient Rehab Plan: Continue with current plan of care    GO     Metro KungOleksiak, Amy K, MA, CCC-SLP 02/19/2014, 11:13 AM

## 2014-02-19 NOTE — Progress Notes (Addendum)
   Patient was admitted for stroke. Cardiology was consulted to perform a TEE to assess for atrial clot and possible loop recorder to assess for Afib for tomorrow, 02/20/14. She has residual aphasia thus communication is difficult. She is unable to consent. I contacted her daughter, Joylene JohnLibby Crosby. I explained to her the purpose of the procedure and explained potential risk involved. She wishes to discus with her sister (patient's other daughter) before consenting. They will visit later this evening and will sign consent when they arrive, if they agree. Will notify RN.  Robbie LisBrittainy Simmons, PA-C    TEE has now been rescheduled for 02/21/14 @ 12 noon due to scheduling confusion. NPO after midnight tomorrow night.   Thereasa ParkinKathryn Stern PA-C  [facility]

## 2014-02-19 NOTE — Progress Notes (Signed)
Stroke Team Progress Note  HISTORY Kayla Huynh is an 80 y.o. female who lives with her grandson who is her primary care giver. At baseline she is fairly functional, able to wash and bath herself, walk with walker, able to feed herself and groom herself. She was last seen normal at 12:35 02/15/2014. When Grandson came back to check on her she was noted to be slumped over and not following commands. EMS was called and patient was brought to ED as a code stroke. On arrival she had a dense right right hemiplegia, right hemianopsia and right sided neglect. CT brain showed a probable hyperdense left MCA sign and early loss of gray-white discrimination in the insula and posterior frontal region on the left Discussion was had with family and family decided they would like tPA to be given as she is within the 3 hour window and functional at home. NIHSS: 22. Patient was administered TPA. She was admitted to the neuro ICU for further evaluation and treatment.  SUBJECTIVE No acute events overnight. The patient was lethargic and does not follow commands and non-verbal after 3 attempts to put in NG tube. However, she was more awake alert in the afternoon after MRI. Will do TEE and loop recorder in am.             OBJECTIVE Most recent Vital Signs: Filed Vitals:   02/18/14 1833 02/18/14 2154 02/19/14 0200 02/19/14 0535  BP: 102/77 111/55 129/74 112/75  Pulse: 62 63 60 73  Temp: 100 F (37.8 C) 99.5 F (37.5 C) 98.9 F (37.2 C) 98.3 F (36.8 C)  TempSrc: Axillary Axillary Axillary Oral  Resp: 20 20 19 18   Height:      Weight:      SpO2: 98% 97% 98% 98%   CBG (last 3)   Recent Labs  02/19/14 0040 02/19/14 0411 02/19/14 0835  GLUCAP 77 75 73    IV Fluid Intake:   . sodium chloride 75 mL/hr at 02/19/14 0528    MEDICATIONS  .  stroke: mapping our early stages of recovery book   Does not apply Once  . antiseptic oral rinse  15 mL Mouth Rinse BID  . antiseptic oral rinse  15 mL Mouth Rinse q12n4p   . aspirin  300 mg Rectal Daily  . chlorhexidine  15 mL Mouth Rinse BID  . dextrose  25 mL Intravenous Once  . pantoprazole (PROTONIX) IV  40 mg Intravenous QHS   PRN:  acetaminophen, acetaminophen, hydrALAZINE, senna-docusate  Diet:  NPO  Activity:  Bedrest DVT Prophylaxis:  SCDs   CLINICALLY SIGNIFICANT STUDIES Basic Metabolic Panel:   Recent Labs Lab 02/15/14 1446 02/15/14 1525 02/19/14 0607  NA 144 143 141  K 4.3 4.2 3.4*  CL 106 109 106  CO2 23  --  16*  GLUCOSE 110* 107* 70  BUN 20 22 14   CREATININE 1.03 1.10 0.72  CALCIUM 8.9  --  8.2*   Liver Function Tests:   Recent Labs Lab 02/15/14 1446  AST 12  ALT 7  ALKPHOS 57  BILITOT 0.2*  PROT 6.6  ALBUMIN 3.3*   CBC:   Recent Labs Lab 02/15/14 1446 02/15/14 1525 02/19/14 0607  WBC 6.6  --  7.6  NEUTROABS 4.0  --  5.4  HGB 10.8* 12.2 10.4*  HCT 34.4* 36.0 32.7*  MCV 83.7  --  82.4  PLT 190  --  201   Coagulation:   Recent Labs Lab 02/15/14 1446  LABPROT 14.3  INR  1.11   Cardiac Enzymes: No results found for this basename: CKTOTAL, CKMB, CKMBINDEX, TROPONINI,  in the last 168 hours Urinalysis:   Recent Labs Lab 02/15/14 2215  COLORURINE YELLOW  LABSPEC 1.014  PHURINE 7.0  GLUCOSEU NEGATIVE  HGBUR NEGATIVE  BILIRUBINUR NEGATIVE  KETONESUR NEGATIVE  PROTEINUR 30*  UROBILINOGEN 1.0  NITRITE NEGATIVE  LEUKOCYTESUR NEGATIVE   Lipid Panel    Component Value Date/Time   CHOL 173 02/16/2014 0245   TRIG 189* 02/16/2014 0245   HDL 33* 02/16/2014 0245   CHOLHDL 5.2 02/16/2014 0245   VLDL 38 02/16/2014 0245   LDLCALC 102* 02/16/2014 0245   HgbA1C  Lab Results  Component Value Date   HGBA1C 6.1* 02/16/2014    Urine Drug Screen:     Component Value Date/Time   LABOPIA NONE DETECTED 02/15/2014 2215   COCAINSCRNUR NONE DETECTED 02/15/2014 2215   LABBENZ NONE DETECTED 02/15/2014 2215   AMPHETMU NONE DETECTED 02/15/2014 2215   THCU NONE DETECTED 02/15/2014 2215   LABBARB NONE DETECTED 02/15/2014  2215    Alcohol Level:   Recent Labs Lab 02/15/14 1446  ETH <11     CT of the brain  02/15/2014    Atrophy and chronic small vessel ischemic changes.  Probable hyperdense left MCA and early loss of gray-white discrimination in the insula and posterior frontal region on the left. No hemorrhage.                                                                                                                                                                                 MRI of the brain  02/15/14  Large acute nonhemorrhagic left middle cerebral artery distribution  infarct involves portions of the left temporal lobe, left frontal  lobe, left parietal lobe, left subinsular region, left peri  operculum region and left lenticular nucleus/ caudate head. Mild  local mass effect upon the left lateral ventricle. There is minimal  bowing of the septum to the right.  Scattered small areas of blood breakdown products separate from the  left hemispheric infarct and most notable in the right  parietal-occipital lobe consistent with remote hemorrhagic infarct.  Small vessel disease type changes.  MRA Head 02/15/14 Thrombosed left middle cerebral artery branch vessels. No high-grade  stenosis of the left middle cerebral artery bifurcation, M1 segment  left middle cerebral artery or left carotid terminus.  No significant narrowing of either internal carotid artery cavernous  or supraclinoid segment.  Mild to moderate narrowing A1 segment right anterior cerebral  artery. Mild narrowing M1 segment right middle cerebral artery.  Right middle cerebral artery branch vessel irregularity.  Abnormal appearance of the left vertebral artery which may reflect  proximal stenosis with poor distal flow.  Carotid Doppler  02/17/14 Prelim <39% stenosis bilat  2D Echocardiogram  02/16/14 EF 55-60%, no definite cardiac source of emboli identified  CXR  02/15/2014   1. Cardiomegaly and trace bilateral effusions without  definite evidence of edema. 2. Hyperexpanded lungs and bibasilar atelectasis without discrete focal airspace opacities to suggest pneumonia.   EKG  02/19/14 Sinus brady. However, tele strip really suspicious for afib but cardiology does not think it is the case.  Therapy Recommendations pending  Physical Exam   Frail elderly lady not in distress. Afebrile. Head is nontraumatic. Neck is supple without bruit. Hearing is normal. Cardiac exam no murmur or gallop, regular heart rhythm but brady. Lungs are clear to auscultation. Distal pulses are well felt. Neurological Exam: Lethargic but arousable. R hemianopsia.  Pupils 3 mm irregular reactive. No verbal output, minimally followed commands. Right lower facial weakness. Tongue midline. Motor system exam reveals right UE proximal 2/5 withdraw to pain, but 0/5 distally. Mild withdrawal of the right lower extremity to painful stimuli. Purposeful antigravity strength on the left side. Right plantar upgoing left downgoing. Sensation not cooperative to test but respond to pain stimulation. Coordination and gait were not tested  ASSESSMENT Ms. Kayla Huynh is a 80 y.o. female presenting with acute onset of aphasia, right hemiplegia, right hemianopsia. Status post IV t-PA 02/15/2014 at 1522. Patient with large L MCA infarct. Infarct felt to be  Embolic, underlying etiology unknown.  On aspirin 81 mg orally every day prior to admission. Now on ASA 300mg  PR for secondary stroke prevention. Patient with resultant complete global aphasia, right visual field cut, right hemiparesis, and dysphagia. She is at risk for neurologic worsening, cerebral edema and poor outcome. Stroke work up underway.  Stroke: Large L MCA infarct, likely embolic, underlying etiology unknown  Carotid US <39% stenosis bilat  2D Echo EF 55-60%, no definite cardiac source of emboli identified  EKG 02/19/14 sinus brady but tele suspicious for afib  Cardiology contacted but does not think can  be diagnosed with afib yet, will do TEE and loop recording in am  On ASA 325mg  daily for now  NPO due to dysphagia, ordered NG tube and dietary consult for tube feed recs  LDL 102, add lipitor 40mg , goal < 70  HgbA1c 6.1  Bradycardia, arrhythmia, Hypertension  on norvasc at home  Holding for permissive HTN  BP 98-129/74-76 in last 24 hours   Tele suspicious for afib but cardiology does not think it is the case yet  TEE and loop recorder in am  Hyperlipidemia  LDL 102, put on lipitor 40mg  daily  goal LDL < 70   Diabetes  HgbA1c 6.1, meeting goal < 6.5  on metformin at home  BG 73-121 in last 24 H  Got 25 ml D50 this am for BG 73, came up to 121  Hypokalemia  Supplement KCl through NG tube.  Check BMP in am  Dispo: likely SNF Code Status: limited code with medication only, no compressions or intubation.  Hospital day # 4  SIGNED Stephani Policeeirdre Fraller, MSN, ANP-C, Driscilla GrammesCNRN, MSCS [place] Stroke Team [phone] 02/19/2014 9:06 AM   I, the attending vascular neurologist, have personally obtained a history, examined the patient, evaluated laboratory data, individually viewed imaging studies, and formulated the assessment and plan of care.  I have made any additions or clarifications directly to the above note and agree with the findings and plan as currently documented.    Marvel PlanJindong Erla Bacchi, MD PhD  02/19/2014 5:49 PM       To contact Stroke Continuity provider, please refer to [email or web address]. After hours, contact General Neurology

## 2014-02-19 NOTE — Progress Notes (Signed)
TEE education printed out for family to read and Loop Recorder educational video viewed as well.  At this time, pt family would like to proceed with the TEE but not loop recorder.

## 2014-02-19 NOTE — Progress Notes (Signed)
CARE MANAGEMENT NOTE 02/19/2014  Patient:  Kayla Huynh,Kayla Huynh   Account Number:  [id number]  Date Initiated:  02/19/2014  Documentation initiated by:  Jiles CrockerHANDLER,Maximiano Lott  Subjective/Objective Assessment:   ADMITTED WITH CVA     Action/Plan:   CM FOLLOWING FOR DCP   Anticipated DC Date:  02/22/2014   Anticipated DC Plan:  SKILLED NURSING FACILITY  In-house referral  Clinical Social Worker      DC Planning Services  CM consult          Status of service:  In process, will continue to follow Medicare Important Message given?  YES (If response is "NO", the following Medicare IM given date fields will be blank) Date Medicare IM given:  02/19/2014 Medicare IM given by:  Jiles CrockerHANDLER,Everlean Bucher  Per UR Regulation:  Reviewed for med. necessity/level of care/duration of stay  Comments:  02/19/2014- Patient lethargic; IM given to daughter/ all questions answered; Alexis GoodellB Charl Wellen RN,BSN,MHA [phone]

## 2014-02-19 NOTE — Progress Notes (Signed)
Pt less alert/lethargic for 1200 neuro exam, MD aware and in the room for exam.  NG tube placement attempted x3 just prior to this change.  NG tube kept ending up in L mainstem bronchus, as confirmed by ABD XR.  NG tube to be placed under flouro in IR.  MRI called to confirm that MRI/MRA is to be done today.  EKG done and in pt chart.  Will monitor.

## 2014-02-19 NOTE — Clinical Social Work Psychosocial (Signed)
Clinical Social Work Department BRIEF PSYCHOSOCIAL ASSESSMENT 02/19/2014  Patient:  Kayla Huynh,Kayla Huynh     Account Number:  [id number]     Admit date:  02/15/2014  Clinical Social Worker:  Mosie EpsteinVAUGHN,Ismael Karge S, LCSWA  Date/Time:  02/19/2014 02:50 PM  Referred by:  Physician  Date Referred:  02/19/2014 Referred for  SNF Placement   Other Referral:   none.   Interview type:  Family Other interview type:   CSW spoke with pt's daughter, Kayla Huynh [phone]].    PSYCHOSOCIAL DATA Living Status:  FAMILY Admitted from facility:   Level of care:   Primary support name:  Kayla Huynh Primary support relationship to patient:  FAMILY Degree of support available:   Pt lives with her grandson, Kayla Huynh. Pt also receives strong support from her two daughters.    CURRENT CONCERNS Current Concerns  Post-Acute Placement   Other Concerns:   none.    SOCIAL WORK ASSESSMENT / PLAN CSW consulted for possible SNF placement at time of discharge. CSW contacted pt's grandson, Kayla Huynh, as report indicated pt lives with her grandson who cares for her. Pt's grandson requested CSW speak with his mother and aunt (pt's two daughters), regarding discharge planning.    CSW spoke with Kayla Huynh, pt's daughter, regarding discharge planning. Pt's daughter stated pt has previously been in SNF and family believes this is the best choice for pt at this time. Pt's daughter informed CSW [facility], but are open to options.    CSW to continue to follow and assist with discharge planning needs.   Assessment/plan status:  Psychosocial Support/Ongoing Assessment of Needs Other assessment/ plan:   none.   Information/referral to community resources:   [facility]    PATIENT'S/FAMILY'S RESPONSE TO PLAN OF CARE: Pt's daughter understanding and agreeable to CSW plan of care. Pt's daughter expressed no further questions  or concerns at this time.       Marcelline DeistEmily Nakiyah Beverley, MSW, [facility]WA Licensed Clinical Social Worker [phone] and [phone] [phone]

## 2014-02-19 NOTE — Clinical Social Work Placement (Addendum)
Clinical Social Work Department CLINICAL SOCIAL WORK PLACEMENT NOTE 02/19/2014  Patient:  [name],Kayla  Account Number:  [id number] Admit date:  02/15/2014  Clinical Social Worker:  Mosie EpsteinEMILY S Dawnna Gritz, LCSWA  Date/time:  02/19/2014 03:09 PM  Clinical Social Work is seeking post-discharge placement for this patient at the following level of care:   SKILLED NURSING   (*CSW will update this form in Epic as items are completed)   02/19/2014  Patient/family provided with [facility] of Clinical Social Work's list of facilities offering this level of care within the geographic area requested by the patient (or if unable, by the patient's family).  02/19/2014  Patient/family informed of their freedom to choose among providers that offer the needed level of care, that participate in Medicare, Medicaid or managed care program needed by the patient, have an available bed and are willing to accept the patient.  02/19/2014  Patient/family informed of [facility]' ownership interest in [facility], as well as of the fact that they are under no obligation to receive care at this facility.  PASARR submitted to EDS on  PASARR number received on   FL2 transmitted to all facilities in geographic area requested by pt/family on  02/19/2014 FL2 transmitted to all facilities within larger geographic area on 02/19/2014  Patient informed that his/her managed care company has contracts with or will negotiate with  certain facilities, including the following:     Patient/family informed of bed offers received:  02/20/2014 [facility] recommends and patient chooses bed at    [facility] on  02/22/2014 Patient to be transferred to facility by [facility] Patient and family notified of transfer on 02/22/2014 Name of family member notified:  Joylene JohnLibby Crosby (daughter)  The following physician request were entered in Epic:   Additional  Comments:  Marcelline DeistEmily Naika Noto, MSW, [facility] Licensed Clinical Social Worker [phone] and [phone] [phone]

## 2014-02-19 NOTE — Progress Notes (Signed)
PT Cancellation Note  Patient Details Name: Kayla Huynh MRN: [id number] DOB: 10/30/1913   Cancelled Treatment:    Reason Eval/Treat Not Completed: Patient at procedure or test/unavailable. RN placing NG tube at this time and then have to await abdominal x-ray. PT to return as able.   Kayla Huynh, Kayla Huynh 02/19/2014, 12:20 PM  Kayla Huynh, PT, DPT Pager #: [phone] Office #: [phone]

## 2014-02-19 NOTE — Progress Notes (Signed)
Jevity initiated at 20 cc/hr per order, placement verified by auscultation.  Per order, rate will increase by 10 cc/hr q 6 hr.  Will pass along to pm RN.

## 2014-02-20 LAB — BASIC METABOLIC PANEL
ANION GAP: 12 (ref 5–15)
BUN: 14 mg/dL (ref 6–23)
CALCIUM: 8.4 mg/dL (ref 8.4–10.5)
CO2: 19 mEq/L (ref 19–32)
Chloride: 110 mEq/L (ref 96–112)
Creatinine, Ser: 0.7 mg/dL (ref 0.50–1.10)
GFR, EST AFRICAN AMERICAN: 81 mL/min — AB (ref >90)
GFR, EST NON AFRICAN AMERICAN: 70 mL/min — AB (ref >90)
Glucose, Bld: 108 mg/dL — ABNORMAL HIGH (ref 70–99)
Potassium: 4.1 mEq/L (ref 3.7–5.3)
Sodium: 141 mEq/L (ref 137–147)

## 2014-02-20 LAB — GLUCOSE, CAPILLARY
GLUCOSE-CAPILLARY: 127 mg/dL — AB (ref 70–99)
GLUCOSE-CAPILLARY: 130 mg/dL — AB (ref 70–99)
GLUCOSE-CAPILLARY: 142 mg/dL — AB (ref 70–99)
GLUCOSE-CAPILLARY: 154 mg/dL — AB (ref 70–99)
Glucose-Capillary: 130 mg/dL — ABNORMAL HIGH (ref 70–99)
Glucose-Capillary: 136 mg/dL — ABNORMAL HIGH (ref 70–99)
Glucose-Capillary: 96 mg/dL (ref 70–99)

## 2014-02-20 LAB — CBC
HEMATOCRIT: 33.4 % — AB (ref 36.0–46.0)
Hemoglobin: 10.7 g/dL — ABNORMAL LOW (ref 12.0–15.0)
MCH: 26.7 pg (ref 26.0–34.0)
MCHC: 32 g/dL (ref 30.0–36.0)
MCV: 83.3 fL (ref 78.0–100.0)
PLATELETS: 229 10*3/uL (ref 150–400)
RBC: 4.01 MIL/uL (ref 3.87–5.11)
RDW: 14.5 % (ref 11.5–15.5)
WBC: 7.9 10*3/uL (ref 4.0–10.5)

## 2014-02-20 MED ORDER — AMANTADINE HCL 100 MG PO CAPS
100.0000 mg | ORAL_CAPSULE | Freq: Two times a day (BID) | ORAL | Status: DC
  Administered 2014-02-20 – 2014-02-22 (×4): 100 mg via ORAL
  Filled 2014-02-20 (×6): qty 1

## 2014-02-20 MED ORDER — AMLODIPINE BESYLATE 2.5 MG PO TABS
5.0000 mg | ORAL_TABLET | Freq: Every day | ORAL | Status: DC
  Administered 2014-02-20 – 2014-02-22 (×2): 5 mg via ORAL
  Filled 2014-02-20 (×2): qty 2

## 2014-02-20 NOTE — Progress Notes (Signed)
OT Cancellation Note  Patient Details Name: Scarlette ShortsLouise Emry MRN: [id number] DOB: Nov 18, 1913   Cancelled Treatment:    Reason Eval/Treat Not Completed: Fatigue/lethargy limiting ability to participate. Unable to arouse pt for session.  Earlie RavelingStraub, Jomayra Novitsky L OTR/L [phone] 02/20/2014, 2:12 PM

## 2014-02-20 NOTE — Progress Notes (Signed)
Speech Language Pathology Treatment: Dysphagia;Cognitive-Linquistic  Patient Details Name: Kayla Huynh MRN: [id number] DOB: [birth year] Today's Date: 02/20/2014 Time: [id number] SLP Time Calculation (min): 19 min  Assessment / Plan / Recommendation Clinical Impression  Pt is lethargic this morning, requiring Total A to follow one-step commands and with no vocalizations or verbalizations noted despite Max multimodal cueing. Pt required Max cues and stimulation to maintain alertness for brief seconds at a time. SLP provided oral care via suctioning prior to offering ice chip, to which pt exhibited decreased focused attention and awareness, and did not orally accept from spoon. SLP placed a few drops of water on the edge of pt's tongue, which was primarily spilled anteriorly. Despite water draining from pt's mouth, pt did respond with multiple swallows. Pt's LOA was not suitable for further PO trials; SLP provided education to daughter Dennie Bibleat regarding safe PO intake as well as current cognitive-linguistic function. Will continue to follow.   HPI HPI: 80 y/o female who lives with her grandson who is her primary care giver.  Patient brought to ED as code stroke.  Dense right hemiplegia, right heminanopsia, and right sided neglect.    Pertinent Vitals n/a  SLP Plan  Continue with current plan of care    Recommendations Diet recommendations: NPO;Other(comment) (pt now was NGT) Medication Administration: Via alternative means              Oral Care Recommendations: Oral care Q4 per protocol;Oral care prior to ice chips Follow up Recommendations: Skilled Nursing facility Plan: Continue with current plan of care    GO      Kayla Huynh, M.A. CCC-SLP [phone]  Kayla Hamaiewonsky, Kayla Huynh 02/20/2014, 11:29 AM

## 2014-02-20 NOTE — Progress Notes (Signed)
Stroke Team Progress Note  HISTORY Kayla Huynh is an 80 y.o. female who lives with her grandson who is her primary care giver. At baseline she is fairly functional, able to wash and bath herself, walk with walker, able to feed herself and groom herself. She was last seen normal at 12:35 02/15/2014. When Grandson came back to check on her she was noted to be slumped over and not following commands. EMS was called and patient was brought to ED as a code stroke. On arrival she had a dense right right hemiplegia, right hemianopsia and right sided neglect. CT brain showed a probable hyperdense left MCA sign and early loss of gray-white discrimination in the insula and posterior frontal region on the left Discussion was had with family and family decided they would like tPA to be given as she is within the 3 hour window and functional at home. NIHSS: 22. Patient was administered TPA. She was admitted to the neuro ICU for further evaluation and treatment.  SUBJECTIVE No acute events overnight. The patient was lethargic and does not follow commands and non-verbal. Plan is to do TEE and loop recorder tomorrow. DIscussed possibility of Palliative Care with patient's daughter, she would like to talk to her sister today and will let us know about whether or not to proceed with the TEE and whether or not to order Palliative Care Consult.   OBJECTIVE Most recent Vital Signs: Filed Vitals:   02/19/14 2100 02/20/14 0149 02/20/14 0500 02/20/14 0623  BP:  173/70  161/79  Pulse:  82  68  Temp:  99 F (37.2 C)  98.8 F (37.1 C)  TempSrc:  Axillary  Axillary  Resp:  18  18  Height:      Weight: 63.005 kg (138 lb 14.4 oz)  63.095 kg (139 lb 1.6 oz)   SpO2:  97%  97%   CBG (last 3)   Recent Labs  02/19/14 2008 02/20/14 0004 02/20/14 0425  GLUCAP 94 96 127*    IV Fluid Intake:   . sodium chloride 75 mL/hr at 02/19/14 0528  . feeding supplement (JEVITY 1.2 CAL) 55 mL/hr at 02/19/14 1621    MEDICATIONS   .  stroke: mapping our early stages of recovery book   Does not apply Once  . antiseptic oral rinse  15 mL Mouth Rinse BID  . antiseptic oral rinse  15 mL Mouth Rinse q12n4p  . aspirin  325 mg Oral Daily  . atorvastatin  40 mg Oral q1800  . chlorhexidine  15 mL Mouth Rinse BID  . heparin subcutaneous  5,000 Units Subcutaneous 3 times per day  . pantoprazole sodium  40 mg Per Tube Daily   PRN:  acetaminophen, hydrALAZINE, senna-docusate  Diet:  NPO  Activity:  Bedrest DVT Prophylaxis:  SCDs   CLINICALLY SIGNIFICANT STUDIES Basic Metabolic Panel:   Recent Labs Lab 02/19/14 0607 02/20/14 0409  NA 141 141  K 3.4* 4.1  CL 106 110  CO2 16* 19  GLUCOSE 70 108*  BUN 14 14  CREATININE 0.72 0.70  CALCIUM 8.2* 8.4   Liver Function Tests:   Recent Labs Lab 02/15/14 1446  AST 12  ALT 7  ALKPHOS 57  BILITOT 0.2*  PROT 6.6  ALBUMIN 3.3*   CBC:   Recent Labs Lab 02/15/14 1446  02/19/14 0607 02/20/14 0409  WBC 6.6  --  7.6 7.9  NEUTROABS 4.0  --  5.4  --   HGB 10.8*  < > 10.4* 10.7*  HCT 34.4*  < > 32.7* 33.4*  MCV 83.7  --  82.4 83.3  PLT 190  --  201 229  < > = values in this interval not displayed. Coagulation:   Recent Labs Lab 02/15/14 1446  LABPROT 14.3  INR 1.11   Cardiac Enzymes: No results found for this basename: CKTOTAL, CKMB, CKMBINDEX, TROPONINI,  in the last 168 hours Urinalysis:   Recent Labs Lab 02/15/14 2215  COLORURINE YELLOW  LABSPEC 1.014  PHURINE 7.0  GLUCOSEU NEGATIVE  HGBUR NEGATIVE  BILIRUBINUR NEGATIVE  KETONESUR NEGATIVE  PROTEINUR 30*  UROBILINOGEN 1.0  NITRITE NEGATIVE  LEUKOCYTESUR NEGATIVE   Lipid Panel    Component Value Date/Time   CHOL 173 02/16/2014 0245   TRIG 189* 02/16/2014 0245   HDL 33* 02/16/2014 0245   CHOLHDL 5.2 02/16/2014 0245   VLDL 38 02/16/2014 0245   LDLCALC 102* 02/16/2014 0245   HgbA1C  Lab Results  Component Value Date   HGBA1C 6.1* 02/16/2014    Urine Drug Screen:     Component Value  Date/Time   LABOPIA NONE DETECTED 02/15/2014 2215   COCAINSCRNUR NONE DETECTED 02/15/2014 2215   LABBENZ NONE DETECTED 02/15/2014 2215   AMPHETMU NONE DETECTED 02/15/2014 2215   THCU NONE DETECTED 02/15/2014 2215   LABBARB NONE DETECTED 02/15/2014 2215    Alcohol Level:   Recent Labs Lab 02/15/14 1446  ETH <11     CT of the brain  02/15/2014    Atrophy and chronic small vessel ischemic changes.  Probable hyperdense left MCA and early loss of gray-white discrimination in the insula and posterior frontal region on the left. No hemorrhage.                                                                                                                                                                                 MRI of the brain  02/15/14  Large acute nonhemorrhagic left middle cerebral artery distribution  infarct involves portions of the left temporal lobe, left frontal  lobe, left parietal lobe, left subinsular region, left peri  operculum region and left lenticular nucleus/ caudate head. Mild  local mass effect upon the left lateral ventricle. There is minimal  bowing of the septum to the right.  Scattered small areas of blood breakdown products separate from the  left hemispheric infarct and most notable in the right  parietal-occipital lobe consistent with remote hemorrhagic infarct.  Small vessel disease type changes.  MRA Head 02/15/14 Thrombosed left middle cerebral artery branch vessels. No high-grade  stenosis of the left middle cerebral artery bifurcation, M1 segment  left middle cerebral artery or left carotid terminus.  No significant narrowing of either internal  carotid artery cavernous  or supraclinoid segment.  Mild to moderate narrowing A1 segment right anterior cerebral  artery. Mild narrowing M1 segment right middle cerebral artery.  Right middle cerebral artery branch vessel irregularity.  Abnormal appearance of the left vertebral artery which may reflect  proximal  stenosis with poor distal flow.  Carotid Doppler  02/17/14 Prelim <39% stenosis bilat  2D Echocardiogram  02/16/14 EF 55-60%, no definite cardiac source of emboli identified  Transesophageal Echocardiogram: pending  CXR  02/15/2014   1. Cardiomegaly and trace bilateral effusions without definite evidence of edema. 2. Hyperexpanded lungs and bibasilar atelectasis without discrete focal airspace opacities to suggest pneumonia.   EKG  02/19/14 Sinus brady. However, tele strip really suspicious for afib but cardiology does not think it is the case.  Therapy Recommendations pending  Physical Exam   Frail elderly lady not in distress. Afebrile. Head is nontraumatic. Neck is supple without bruit. Hearing is normal. Cardiac exam no murmur or gallop, regular heart rhythm but brady. Lungs are clear to auscultation. Distal pulses are well felt. Neurological Exam: Lethargic but arousable. R hemianopsia.  Pupils 3 mm irregular reactive. No verbal output, minimally followed commands. Right lower facial weakness. Tongue midline. Motor system exam reveals right UE proximal 2/5 withdraw to pain, but 0/5 distally. Mild withdrawal of the right lower extremity to painful stimuli. Purposeful antigravity strength on the left side. Right plantar upgoing left downgoing. Sensation not cooperative to test but respond to pain stimulation. Coordination and gait were not tested  ASSESSMENT Ms. Laurena Valko is a 80 y.o. female presenting with acute onset of aphasia, right hemiplegia, right hemianopsia. Status post IV t-PA 02/15/2014 at 1522. Patient with large L MCA infarct. Infarct felt to be  Embolic, underlying etiology unknown.  On aspirin 81 mg orally every day prior to admission. Now on ASA 325mg  daily for secondary stroke prevention. Patient with resultant complete global aphasia, right visual field cut, right hemiparesis, and dysphagia. She is at risk for neurologic worsening, cerebral edema and poor outcome. Stroke work  up underway.  Stroke: Large L MCA infarct, likely embolic, underlying etiology unknown  Carotid US <39% stenosis bilat  2D Echo EF 55-60%, no definite cardiac source of emboli identified  EKG 02/19/14 sinus brady but tele suspicious for afib  Cardiology contacted but does not think can be diagnosed with afib yet  On ASA 325mg  daily for now  NPO due to dysphagia, NG tube in place and patient receiving tube feeds per dietary recommendations  LDL 102, add lipitor 40mg , goal < 70  HgbA1c 6.1  Plan is to doTEE and loop recorder tomorrow.   Discussed possibility of Palliative Care with patient's daughter, she would like to talk to her sister today and will let us know about whether or not to proceed with the TEE and whether or not to order Palliative Care Consult.   Bradycardia, arrhythmia, Hypertension  on norvasc at home  BP high today, added norvasc 5mg . Will need gradually normalize BP in 5-7 days   Tele suspicious for afib but cardiology does not think it is the case yet  TEE and loop recorder tomorrow unless family decides to pursue Palliative Care  Hyperlipidemia  LDL 102, put on lipitor 40mg  daily  goal LDL < 70   Diabetes  HgbA1c 6.1, meeting goal < 6.5  on metformin at home  BG 73-121 in last 24 H  Hypokalemia  Supplement KCl through NG tube.  K 02/19/14 WNL at 4.1  Dispo: likely  SNF Code Status: limited code with medication only, no compressions or intubation.  Hospital day # 5  SIGNED Stephani Police, MSN, ANP-C, Driscilla Grammes Stroke Team [phone] 02/20/2014 9:52 AM  I, the attending vascular neurologist, have personally obtained a history, examined the patient, evaluated laboratory data, individually viewed imaging studies, and formulated the assessment and plan of care.  I have made any additions or clarifications directly to the above note and agree with the findings and plan as currently documented.   Marvel Plan, MD  PhD 02/20/2014 5:49 PM   To contact Stroke Continuity provider, please refer to [email or web address]. After hours, contact General Neurology

## 2014-02-21 ENCOUNTER — Encounter (HOSPITAL_COMMUNITY)
Admission: EM | Disposition: A | Discharge status: Skilled Nursing Facility | Payer: Self-pay | Source: Home / Self Care | Attending: Neurology

## 2014-02-21 ENCOUNTER — Inpatient Hospital Stay (HOSPITAL_COMMUNITY): Payer: Medicare Other

## 2014-02-21 LAB — CBC
HCT: 32.2 % — ABNORMAL LOW (ref 36.0–46.0)
HEMOGLOBIN: 10.4 g/dL — AB (ref 12.0–15.0)
MCH: 26.5 pg (ref 26.0–34.0)
MCHC: 32.3 g/dL (ref 30.0–36.0)
MCV: 82.1 fL (ref 78.0–100.0)
Platelets: 188 10*3/uL (ref 150–400)
RBC: 3.92 MIL/uL (ref 3.87–5.11)
RDW: 15 % (ref 11.5–15.5)
WBC: 7.7 10*3/uL (ref 4.0–10.5)

## 2014-02-21 LAB — BASIC METABOLIC PANEL
Anion gap: 10 (ref 5–15)
BUN: 12 mg/dL (ref 6–23)
CO2: 20 meq/L (ref 19–32)
CREATININE: 0.69 mg/dL (ref 0.50–1.10)
Calcium: 8 mg/dL — ABNORMAL LOW (ref 8.4–10.5)
Chloride: 110 mEq/L (ref 96–112)
GFR calc Af Amer: 81 mL/min — ABNORMAL LOW (ref >90)
GFR calc non Af Amer: 70 mL/min — ABNORMAL LOW (ref >90)
Glucose, Bld: 111 mg/dL — ABNORMAL HIGH (ref 70–99)
Potassium: 4.1 mEq/L (ref 3.7–5.3)
Sodium: 140 mEq/L (ref 137–147)

## 2014-02-21 LAB — GLUCOSE, CAPILLARY
GLUCOSE-CAPILLARY: 113 mg/dL — AB (ref 70–99)
GLUCOSE-CAPILLARY: 125 mg/dL — AB (ref 70–99)
Glucose-Capillary: 120 mg/dL — ABNORMAL HIGH (ref 70–99)
Glucose-Capillary: 120 mg/dL — ABNORMAL HIGH (ref 70–99)
Glucose-Capillary: 127 mg/dL — ABNORMAL HIGH (ref 70–99)

## 2014-02-21 SURGERY — ECHOCARDIOGRAM, TRANSESOPHAGEAL
Anesthesia: Moderate Sedation

## 2014-02-21 MED ORDER — ASPIRIN 300 MG RE SUPP
300.0000 mg | Freq: Every day | RECTAL | Status: DC
  Administered 2014-02-21: 300 mg via RECTAL
  Filled 2014-02-21: qty 1

## 2014-02-21 NOTE — Progress Notes (Signed)
OT NOTE  MD please order mattress overlay for patient to decr risk for skin break down. Pt very high risk due to decr mobility.   Kayla Huynh, [name]   OTR/L Pager: [phone] Office: [phone] .

## 2014-02-21 NOTE — Procedures (Signed)
Objective Swallowing Evaluation: Modified Barium Swallowing Study  Patient Details  Name: Kayla Huynh MRN: [id number] Date of Birth: [birth year]  Today's Date: 02/21/2014 Time: 1340-1409 SLP Time Calculation (min): 29 min  Past Medical History:  Past Medical History  Diagnosis Date  . Diabetes mellitus without complication   . Hypertension   . COPD (chronic obstructive pulmonary disease)   . PVD (peripheral vascular disease)   . Acid reflux    Past Surgical History: History reviewed. No pertinent past surgical history. HPI:  80 y/o female who lives with her grandson who is her primary care giver.  Patient brought to ED as code stroke.  Dense right hemiplegia, right heminanopsia, and right sided neglect.      Assessment / Plan / Recommendation Clinical Impression  Dysphagia Diagnosis: Moderate oral phase dysphagia;Mild pharyngeal phase dysphagia Clinical impression: Pt presents with a moderate oral and mild pharyngeal dysphagia, with uncoordinated timing and cognitive status having greatest impact on swallow function. On initial sip, pt demonstrated decreased awareness of bolus in oral cavity with no attempts at lingual manipulation, resulting in nectar thick liquids spilling posteriorly into the pharynx and ultimately being aspirated with strong cough response prior to initiation of a swallow response. With Max cues for self-feeding to increase pt control, pt demonstrated improved oral control and manipulation and minimal delay with pharyngeal response with no further aspiration observed. Recommend to initiate Dys 2 textures and nectar thick liquids, to be consumed only when pt is maximally alert and engaged in activity.    Treatment Recommendation  Therapy as outlined in treatment plan below    Diet Recommendation Dysphagia 2 (Fine chop);Nectar-thick liquid   Liquid Administration via: Cup;No straw Medication Administration: Crushed with puree Supervision: Patient able to self  feed;Staff to assist with self feeding;Full supervision/cueing for compensatory strategies Compensations: Slow rate;Small sips/bites;Check for pocketing;Check for anterior loss Postural Changes and/or Swallow Maneuvers: Seated upright 90 degrees;Upright 30-60 min after meal    Other  Recommendations Oral Care Recommendations: Oral care BID Other Recommendations: Order thickener from pharmacy;Prohibited food (jello, ice cream, thin soups);Remove water pitcher   Follow Up Recommendations  Skilled Nursing facility    Frequency and Duration min 2x/week  2 weeks   Pertinent Vitals/Pain n/a    SLP Swallow Goals     General Date of Onset: 02/15/14 HPI: 80 y/o female who lives with her grandson who is her primary care giver.  Patient brought to ED as code stroke.  Dense right hemiplegia, right heminanopsia, and right sided neglect.  Type of Study: Modified Barium Swallowing Study Reason for Referral: Objectively evaluate swallowing function Previous Swallow Assessment: BSE 7/17 recommending NPO Diet Prior to this Study: NPO Temperature Spikes Noted: Yes (low grade) Respiratory Status: Room air History of Recent Intubation: No Behavior/Cognition: Alert;Cooperative;Pleasant mood;Requires cueing Oral Cavity - Dentition: Adequate natural dentition Oral Motor / Sensory Function: Impaired - see Bedside swallow eval Self-Feeding Abilities: Able to feed self;Needs assist Patient Positioning: Upright in chair Baseline Vocal Quality: Other (comment) (UTA this afternoon) Volitional Cough: Cognitively unable to elicit Volitional Swallow: Unable to elicit Anatomy: Within functional limits Pharyngeal Secretions: Not observed secondary MBS    Reason for Referral Objectively evaluate swallowing function   Oral Phase Oral Preparation/Oral Phase Oral Phase: Impaired Oral - Nectar Oral - Nectar Teaspoon: Right anterior bolus loss;Weak lingual manipulation;Lingual/palatal residue;Reduced posterior  propulsion;Delayed oral transit Oral - Nectar Cup: Right anterior bolus loss;Weak lingual manipulation;Lingual/palatal residue;Reduced posterior propulsion;Delayed oral transit Oral - Nectar Straw: Right anterior bolus loss;Weak lingual  manipulation;Lingual/palatal residue;Reduced posterior propulsion;Delayed oral transit Oral - Solids Oral - Puree: Right anterior bolus loss;Weak lingual manipulation;Lingual/palatal residue;Reduced posterior propulsion;Delayed oral transit Oral - Mechanical Soft: Right anterior bolus loss;Weak lingual manipulation;Lingual/palatal residue;Reduced posterior propulsion;Delayed oral transit Oral Phase - Comment Oral Phase - Comment: pt's awareness of and attention to bolus impacts function most significantly   Pharyngeal Phase Pharyngeal Phase Pharyngeal Phase: Impaired Pharyngeal - Nectar Pharyngeal - Nectar Teaspoon: Delayed swallow initiation Pharyngeal - Nectar Cup: Delayed swallow initiation;Penetration/Aspiration before swallow;Significant aspiration (Amount) Penetration/Aspiration details (nectar cup): Material enters airway, passes BELOW cords and not ejected out despite cough attempt by patient Pharyngeal - Nectar Straw: Delayed swallow initiation Pharyngeal - Solids Pharyngeal - Puree: Within functional limits Pharyngeal - Mechanical Soft: Delayed swallow initiation  Cervical Esophageal Phase    GO    Cervical Esophageal Phase Cervical Esophageal Phase: [facility]        Kayla Huynh, M.A. CCC-SLP [phone]  Kayla Hamaiewonsky, Kayla Huynh 02/21/2014, 2:35 PM

## 2014-02-21 NOTE — Progress Notes (Signed)
Occupational Therapy Treatment Patient Details Name: Rilie Glanz MRN: [id number] DOB: 11-14-1913 Today's Date: 02/21/2014    History of present illness Jream Broyles is an 80 y.o. female who lives with her grandson who is her primary care giver.  At baseline she is fairly functional, able to wash and bath herself, walk with walker, able to feed herself and groom herself.  She was last seen normal at 12:35. When Grandson came back to check on her she was noted to be slumped over and not following commands. EMS was called and patient was brought to ED as a code stroke for admission on 02/15/14. On arrival she had a dense right right hemiplegia, right hemianopsia and right sided neglect. Patient with initial NIHSS of 22. Patient received tPA on 02/15/14 at 15:22.  Post NIHSS was 23.     OT comments  Highly recommend mattress overlay to decr risk for skin break down at this time. Pt did follow simple command in Lt visual field to give "high five" Pt reaching for therapist. Pt static sitting EOB for adls with max to total (A). OT to continue to follow acutely with cognitive recovery and adl retraining.  Follow Up Recommendations  SNF    Equipment Recommendations  None recommended by OT    Recommendations for Other Services      Precautions / Restrictions Precautions Precautions: Fall Restrictions Weight Bearing Restrictions: No       Mobility Bed Mobility Overal bed mobility: Needs Assistance;+2 for physical assistance Bed Mobility: Supine to Sit     Supine to sit: +2 for physical assistance     General bed mobility comments: Pt static sitting max (A) with posterior LOB. pt with posterior pelvic tilt  Transfers Overall transfer level: Needs assistance Equipment used:  (2 person lift with gait belt and pad) Transfers: Sit to/from [employer] Transfers Sit to Stand: Max assist;+2 physical assistance Stand pivot transfers: Max assist;+2 physical assistance       General  transfer comment: PT ashly arriving for PT session and transfer    Balance Overall balance assessment: Needs assistance Sitting-balance support: Single extremity supported;Feet supported Sitting balance-Leahy Scale: Zero Sitting balance - Comments: pt did maintain static sitting balance x 10 sec otherwise required min/modA to maintain upright/midline position     Standing balance-Leahy Scale: Zero Standing balance comment: pt requires assist x2                   ADL Overall ADL's : Needs assistance/impaired Eating/Feeding: NPO   Grooming: Maximal assistance;Wash/dry face;Brushing hair Grooming Details (indicate cue type and reason): pt attempting to initiate but unable to sustain task                               General ADL Comments: Pt supine <>sit eob this session. Pt smiling at therapist several times. pt with decr mobility so requesting air mattress overlay for patient. Pt very high risk for skin break down. pt noted to have incr edema in RT UE. Pt with no subluxation at this time. pt engaged in hair combing this session and attempting to continue task       Vision                 Additional Comments: Pt noted to have Lt deviation but this session did track past midline with watching stuff animal. pt unable to sustain task. Pt with no tracking superior   Perception  Praxis      Cognition   Behavior During Therapy: Flat affect Overall Cognitive Status: Impaired/Different from baseline Area of Impairment: Attention;Following commands;Safety/judgement   Current Attention Level: Focused    Following Commands: Follows one step commands inconsistently;Follows one step commands with increased time Safety/Judgement: Decreased awareness of deficits     General Comments: Pt with delayed response to all commands.Pt reaching for therapist in Lt visual field for high five. Pt with eyes deviated to the left majority of session. Pt required hand over  hand to make a fist  and unable to return demonstrate "thumbs up" Pt reaching for stuff animal in left visual field and smiling at animal    Extremity/Trunk Assessment  Upper Extremity Assessment Upper Extremity Assessment: RUE deficits/detail RUE Deficits / Details: Brunstrom I - flaccid, edema noted and IV in this arm. RN notified of edema adn IV placement RUE Coordination: decreased gross motor;decreased fine motor   Lower Extremity Assessment Lower Extremity Assessment: Defer to PT evaluation RLE Deficits / Details: noted spontaneous movement at EOB with Knee extension during session LLE Deficits / Details: 3- out 5 noted with functional activities   Cervical / Trunk Assessment Cervical / Trunk Assessment: Other exceptions Cervical / Trunk Exceptions: slightly Lt neck rotation due to lt gaze    Exercises     Shoulder Instructions       General Comments      Pertinent Vitals/ Pain       VSS  Home Living Family/patient expects to be discharged to:: Skilled nursing facility                                        Prior Functioning/Environment Level of Independence: Needs assistance;Independent with assistive device(s)  Gait / Transfers Assistance Needed: Patient able to ambulate short distances with RW - grandson provides supervision with gait ADL's / Homemaking Assistance Needed: Total assist for meal prep and housekeeping.  Assist for bathing.  Patient able to dress self.       Frequency Min 2X/week     Progress Toward Goals  OT Goals(current goals can now be found in the care plan section)  Progress towards OT goals: Progressing toward goals  Acute Rehab OT Goals Patient Stated Goal: Unable to state OT Goal Formulation: Patient unable to participate in goal setting Time For Goal Achievement: 03/02/14 Potential to Achieve Goals: Fair ADL Goals Pt Will Perform Grooming: with mod assist;sitting Additional ADL Goal #1: Pt will follow one step  commands during familiar ADL task with gestural and tactile cues Additional ADL Goal #2: Pt will sit EOB with mod A while performing simple self care activity Additional ADL Goal #3: Pt will locate items presented in Rt visual field 50% of the time.   Plan Discharge plan remains appropriate    Co-evaluation                 End of Session     Activity Tolerance Patient tolerated treatment well   Patient Left Other (comment) (EOB with PT ashly arriving)   Nurse Communication Mobility status;Need for lift equipment;Precautions        Time: [id number] OT Time Calculation (min): 14 min  Charges: OT General Charges $OT Visit: 1 Procedure OT Treatments $Self Care/Home Management : 8-22 mins  Boone MasterJones, Harith Mccadden B 02/21/2014, 2:09 PM Pager: [phone]

## 2014-02-21 NOTE — Progress Notes (Signed)
I received a call from Ms. Dehnert's daughter who tells me that she did very well with her swallow eval and is much improved today. She says that they her sister is not willing to meet since she is improved today. I did tell her that I am happy she is improved today but that I still felt it would be important to meet given her age and diagnosis to talk through some scenarios and decisions they may be faced with in the future. She tells me that she believes that information may be useful and may be open to discussing with me further herself but not today. She says that I may check back in tomorrow for follow up and reassess family willingness to speak with me. Will follow up tomorrow.   Yong ChannelAlicia Kenny Rea, NP Palliative Medicine Team Pager # [phone] (M-F 8a-5p) Team Phone # [phone] (Nights/Weekends)

## 2014-02-21 NOTE — Progress Notes (Signed)
Physical Therapy Treatment Patient Details Name: Kayla Huynh MRN: [id number] DOB: 01-05-1914 Today's Date: 02/21/2014    History of Present Illness Kayla Huynh is an 80 y.o. female who lives with her grandson who is her primary care giver.  At baseline she is fairly functional, able to wash and bath herself, walk with walker, able to feed herself and groom herself.  She was last seen normal at 12:35. When Grandson came back to check on her she was noted to be slumped over and not following commands. EMS was called and patient was brought to ED as a code stroke for admission on 02/15/14. On arrival she had a dense right right hemiplegia, right hemianopsia and right sided neglect. Patient with initial NIHSS of 22. Patient received tPA on 02/15/14 at 15:22.  Post NIHSS was 23.      PT Comments    Pt with increased level of alertness and increased participation in PT session this date. Pt tolerated sit to stand transfers and std pvt to chair despite requiring maxAx2. Pt did verbally say yes when asked "are you comfortable?" Pt did smile in return to attempt conversation. Pt demo'd volitional mvmt of R and L LEs however R weaker than L. Pt progressing towards all goals. Con't to recommend ST-SNF Upon d/c.  Follow Up Recommendations  SNF;Supervision/Assistance - 24 hour     Equipment Recommendations  None recommended by PT    Recommendations for Other Services       Precautions / Restrictions Precautions Precautions: Fall Restrictions Weight Bearing Restrictions: No    Mobility  Bed Mobility               General bed mobility comments: pt received sitting EOB with OT  Transfers Overall transfer level: Needs assistance Equipment used:  (2 person lift with gait belt and pad) Transfers: Sit to/from Stand;Stand Pivot Transfers Sit to Stand: Max assist;+2 physical assistance Stand pivot transfers: Max assist;+2 physical assistance       General transfer comment: pt completed 4  sit to stands due to BM during stand pvt transfer. Pt did initiate anterior weight shift and attempted to pull with L UE. Pt attempted to step to chair however required maxA via bed pad under hips to perform lateral weight shift. Pt unable to achieve full trunk extension. Pt fatigued quickly  Ambulation/Gait                 Stairs            Wheelchair Mobility    Modified Rankin (Stroke Patients Only) Modified Rankin (Stroke Patients Only) Pre-Morbid Rankin Score: Moderate disability Modified Rankin: Severe disability     Balance Overall balance assessment: Needs assistance Sitting-balance support: Feet supported;Single extremity supported Sitting balance-Leahy Scale: Poor Sitting balance - Comments: pt did maintain static sitting balance x 10 sec otherwise required min/modA to maintain upright/midline position     Standing balance-Leahy Scale: Zero Standing balance comment: pt requires assist x2                    Cognition Arousal/Alertness: Awake/alert Behavior During Therapy: Flat affect Overall Cognitive Status: Impaired/Different from baseline         Following Commands: Follows one step commands with increased time       General Comments: pt with delayed processing but able to attend to task    Exercises      General Comments General comments (skin integrity, edema, etc.): pt dependent for hygiene  Pertinent Vitals/Pain Denies pain    Home Living                      Prior Function            PT Goals (current goals can now be found in the care plan section) Progress towards PT goals: Progressing toward goals    Frequency  Min 2X/week    PT Plan Current plan remains appropriate    Co-evaluation             End of Session   Activity Tolerance: Patient tolerated treatment well Patient left: in chair;with chair alarm set (lift pad under patient)     Time: [id number] PT Time Calculation (min): 32  min  Charges:  $Therapeutic Activity: 23-37 mins                    G Codes:      Marcene BrawnChadwell, Daine Croker Marie 02/21/2014, 11:53 AM  Lewis ShockAshly Kaylani Fromme, PT, DPT Pager #: [phone] Office #: [phone]

## 2014-02-21 NOTE — Progress Notes (Signed)
Pt noted to be making loud noises, upon entering the room patient was finishing pulling NG tube out. On call notified. Will continue to monitor. Herma ArdMesser, Calvin Chura RN BSN

## 2014-02-21 NOTE — Progress Notes (Signed)
I will meet with Ms. Nakatani's family today 02/21/14 at 1500 pm for goals of care discussion. Thank you for this consult.  Yong ChannelAlicia Kylinn Shropshire, NP Palliative Medicine Team Pager # [phone] (M-F 8a-5p) Team Phone # [phone] (Nights/Weekends)

## 2014-02-21 NOTE — Progress Notes (Signed)
Stroke Team Progress Note  HISTORY Maryan Sivak is an 80 y.o. female who lives with her grandson who is her primary care giver. At baseline she is fairly functional, able to wash and bath herself, walk with walker, able to feed herself and groom herself. She was last seen normal at 12:35 02/15/2014. When Grandson came back to check on her she was noted to be slumped over and not following commands. EMS was called and patient was brought to ED as a code stroke. On arrival she had a dense right right hemiplegia, right hemianopsia and right sided neglect. CT brain showed a probable hyperdense left MCA sign and early loss of gray-white discrimination in the insula and posterior frontal region on the left Discussion was had with family and family decided they would like tPA to be given as she is within the 3 hour window and functional at home. NIHSS: 22. Patient was administered TPA. She was admitted to the neuro ICU for further evaluation and treatment.  SUBJECTIVE Patient pulled NG tube overnight. Started amantadine yesterday, this am patient awake and interactive, sitting up in chair. Family at bedside, discussed Palliative Care, ordered Palliative Care Consult.   OBJECTIVE Most recent Vital Signs: Filed Vitals:   02/20/14 2205 02/21/14 0152 02/21/14 0500 02/21/14 0608  BP: 98/53 100/63  117/61  Pulse: 50 64  63  Temp: 98.5 F (36.9 C) 99.2 F (37.3 C)  98.9 F (37.2 C)  TempSrc: Axillary Axillary  Oral  Resp: 16 20  18   Height:      Weight:   63.504 kg (140 lb)   SpO2: 97% 97%  98%   CBG (last 3)   Recent Labs  02/20/14 2353 02/21/14 0356 02/21/14 0822  GLUCAP 142* 113* 120*    IV Fluid Intake:   . sodium chloride 30 mL/hr at 02/20/14 1431  . feeding supplement (JEVITY 1.2 CAL) 55 mL/hr at 02/20/14 2304    MEDICATIONS  .  stroke: mapping our early stages of recovery book   Does not apply Once  . amantadine  100 mg Oral BID  . amLODipine  5 mg Oral Daily  . antiseptic oral  rinse  15 mL Mouth Rinse BID  . antiseptic oral rinse  15 mL Mouth Rinse q12n4p  . aspirin  325 mg Oral Daily  . atorvastatin  40 mg Oral q1800  . chlorhexidine  15 mL Mouth Rinse BID  . heparin subcutaneous  5,000 Units Subcutaneous 3 times per day  . pantoprazole sodium  40 mg Per Tube Daily   PRN:  acetaminophen, hydrALAZINE, senna-docusate  Diet:  NPO  Activity:  Bedrest DVT Prophylaxis:  SCDs   CLINICALLY SIGNIFICANT STUDIES Basic Metabolic Panel:   Recent Labs Lab 02/20/14 0409 02/21/14 0517  NA 141 140  K 4.1 4.1  CL 110 110  CO2 19 20  GLUCOSE 108* 111*  BUN 14 12  CREATININE 0.70 0.69  CALCIUM 8.4 8.0*   Liver Function Tests:   Recent Labs Lab 02/15/14 1446  AST 12  ALT 7  ALKPHOS 57  BILITOT 0.2*  PROT 6.6  ALBUMIN 3.3*   CBC:   Recent Labs Lab 02/15/14 1446  02/19/14 0607 02/20/14 0409 02/21/14 0517  WBC 6.6  --  7.6 7.9 7.7  NEUTROABS 4.0  --  5.4  --   --   HGB 10.8*  < > 10.4* 10.7* 10.4*  HCT 34.4*  < > 32.7* 33.4* 32.2*  MCV 83.7  --  82.4 83.3 82.1  PLT 190  --  201 229 188  < > = values in this interval not displayed. Coagulation:   Recent Labs Lab 02/15/14 1446  LABPROT 14.3  INR 1.11   Cardiac Enzymes: No results found for this basename: CKTOTAL, CKMB, CKMBINDEX, TROPONINI,  in the last 168 hours Urinalysis:   Recent Labs Lab 02/15/14 2215  COLORURINE YELLOW  LABSPEC 1.014  PHURINE 7.0  GLUCOSEU NEGATIVE  HGBUR NEGATIVE  BILIRUBINUR NEGATIVE  KETONESUR NEGATIVE  PROTEINUR 30*  UROBILINOGEN 1.0  NITRITE NEGATIVE  LEUKOCYTESUR NEGATIVE   Lipid Panel    Component Value Date/Time   CHOL 173 02/16/2014 0245   TRIG 189* 02/16/2014 0245   HDL 33* 02/16/2014 0245   CHOLHDL 5.2 02/16/2014 0245   VLDL 38 02/16/2014 0245   LDLCALC 102* 02/16/2014 0245   HgbA1C  Lab Results  Component Value Date   HGBA1C 6.1* 02/16/2014    Urine Drug Screen:     Component Value Date/Time   LABOPIA NONE DETECTED 02/15/2014 2215    COCAINSCRNUR NONE DETECTED 02/15/2014 2215   LABBENZ NONE DETECTED 02/15/2014 2215   AMPHETMU NONE DETECTED 02/15/2014 2215   THCU NONE DETECTED 02/15/2014 2215   LABBARB NONE DETECTED 02/15/2014 2215    Alcohol Level:   Recent Labs Lab 02/15/14 1446  ETH <11     CT of the brain  02/15/2014    Atrophy and chronic small vessel ischemic changes.  Probable hyperdense left MCA and early loss of gray-white discrimination in the insula and posterior frontal region on the left. No hemorrhage.                                                                                                                                                                                 MRI of the brain  02/15/14  Large acute nonhemorrhagic left middle cerebral artery distribution  infarct involves portions of the left temporal lobe, left frontal  lobe, left parietal lobe, left subinsular region, left peri  operculum region and left lenticular nucleus/ caudate head. Mild  local mass effect upon the left lateral ventricle. There is minimal  bowing of the septum to the right.  Scattered small areas of blood breakdown products separate from the  left hemispheric infarct and most notable in the right  parietal-occipital lobe consistent with remote hemorrhagic infarct.  Small vessel disease type changes.  MRA Head 02/15/14 Thrombosed left middle cerebral artery branch vessels. No high-grade  stenosis of the left middle cerebral artery bifurcation, M1 segment  left middle cerebral artery or left carotid terminus.  No significant narrowing of either internal carotid artery cavernous  or supraclinoid segment.  Mild to moderate narrowing A1 segment right  anterior cerebral  artery. Mild narrowing M1 segment right middle cerebral artery.  Right middle cerebral artery branch vessel irregularity.  Abnormal appearance of the left vertebral artery which may reflect  proximal stenosis with poor distal flow.  Carotid Doppler   02/17/14 Prelim <39% stenosis bilat  2D Echocardiogram  02/16/14 EF 55-60%, no definite cardiac source of emboli identified  Transesophageal Echocardiogram: pending  CXR  02/15/2014   1. Cardiomegaly and trace bilateral effusions without definite evidence of edema. 2. Hyperexpanded lungs and bibasilar atelectasis without discrete focal airspace opacities to suggest pneumonia.   EKG  02/19/14 Sinus brady. However, tele strip really suspicious for afib but cardiology does not think it is the case.  Therapy Recommendations pending  Physical Exam   Frail elderly lady not in distress. Afebrile. Head is nontraumatic. Neck is supple without bruit. Hearing is normal. Cardiac exam no murmur or gallop, regular heart rhythm but brady. Lungs are clear to auscultation. Distal pulses are well felt. Neurological Exam: Alert and interactive. R hemianopsia.  Pupils 3 mm irregular reactive. No verbal output, follows some commands. Right lower facial weakness. Tongue midline. Motor system exam reveals right UE proximal withdraws, distal wiggles fingers on command, RLE wiggles toes on command. LUE 4/5 proximally, weak grip. LLE wiggles toes on command.  Right plantar upgoing left downgoing. Sensation not cooperative to test but respond to pain stimulation. Coordination and gait were not tested  ASSESSMENT Kayla Huynh is a 80 y.o. female presenting with acute onset of aphasia, right hemiplegia, right hemianopsia. Status post IV t-PA 02/15/2014 at 1522. Patient with large L MCA infarct. Infarct felt to be  Embolic, underlying etiology unknown.  On aspirin 81 mg orally every day prior to admission. Now on ASA 325mg  daily for secondary stroke prevention. Patient with resultant complete global aphasia, right visual field cut, right hemiparesis, and dysphagia. She is at risk for neurologic worsening, cerebral edema and poor outcome. Stroke work up underway.  Stroke: Large L MCA infarct, likely embolic, underlying etiology  unknown  Carotid US <39% stenosis bilat  2D Echo EF 55-60%, no definite cardiac source of emboli identified  EKG 02/19/14 sinus brady but tele suspicious for afib  Cardiology contacted but does not think can be diagnosed with afib yet  On ASA 325mg  daily for now  Amantadine to increase alertness  Passed swallow and on dysphagia diet 2  Ordered PT/OT/Speech Re-evals  Palliative Consult ordered  Pt pulled NG tube overnight, awaiting Speech swallow eval and Palliative discussion for decision about feeding   LDL 102, add lipitor 40mg , goal < 70  HgbA1c 6.1  D/Cd TEE and Loop because not felt to be good candidate for anticoagulation  Bradycardia, arrhythmia, Hypertension  Norvasc at home  BP high yesterday, added norvasc 5mg . Will need gradually normalize BP in 5-7 days   BP 98-172/53-84 last 24 hours  Tele suspicious for afib but cardiology does not think it is the case yet  D/Cd TEE and Loop because not felt to be good candidate for anticoagulation  Hyperlipidemia  LDL 102, put on lipitor 40mg  daily  goal LDL < 70   Diabetes  HgbA1c 6.1, meeting goal < 6.5  on metformin at home  BG 96-154 in last 24 H  Hypokalemia  Supplement KCl through NG tube.  K 02/21/14 WNL at 4.1  Dispo: likely SNF vs Hospice Code Status: limited code with medication only, no compressions or intubation.  Hospital day # 6  SIGNED Deirdre Lubertha Sayres, MSN, ANP-C, CNRN, MSCS  [facility] Stroke Team [phone] 02/21/2014 9:47 AM  I, the attending vascular neurologist, have personally obtained a history, examined the patient, evaluated laboratory data, individually viewed imaging studies, and formulated the assessment and plan of care.  I have made any additions or clarifications directly to the above note and agree with the findings and plan as currently documented.   Marvel Plan, MD PhD 02/21/2014 10:58 PM    To contact Stroke Continuity provider, please refer to  [email or web address]. After hours, contact General Neurology

## 2014-02-21 NOTE — Progress Notes (Signed)
Speech Language Pathology Treatment: Dysphagia;Cognitive-Linquistic  Patient Details Name: Kayla Huynh MRN: [id number] DOB: 20-Aug-1913 Today's Date: 02/21/2014 Time: [id number] SLP Time Calculation (min): 35 min  Assessment / Plan / Recommendation Clinical Impression  Pt presents with increased LOA today compared to previous date, with increased participation and initiation with functional activities. Pt continues to present with a mixed aphasia, although with improved receptive comprehension today. Pt required Mod-Max multimodal cues to complete one-step commands.  Pt showed immediate coughing with thin liquids, likely indicating aspiration before the swallow. Pt consumed Dys 1 and 2 textures and nectar thick liquids with decreased oral control and delayed coughing noted, which may be indicative of residuals from pharyngeal weakness. Given the above and family's wishes to initiate a PO diet if able, recommend MBS to determine least restrictive diet.    HPI HPI: 80 y/o female who lives with her grandson who is her primary care giver.  Patient brought to ED as code stroke.  Dense right hemiplegia, right heminanopsia, and right sided neglect.    Pertinent Vitals n/a  SLP Plan  MBS    Recommendations Diet recommendations: NPO (pending objective testing) Medication Administration: Via alternative means              Oral Care Recommendations: Oral care Q4 per protocol;Oral care prior to ice chips Follow up Recommendations: Skilled Nursing facility Plan: MBS    GO      Kayla Huynh, M.A. CCC-SLP [phone]  Kayla Huynh, Kayla Huynh 02/21/2014, 1:11 PM

## 2014-02-22 DIAGNOSIS — I6340 Cerebral infarction due to embolism of unspecified cerebral artery: Principal | ICD-10-CM

## 2014-02-22 DIAGNOSIS — Z515 Encounter for palliative care: Secondary | ICD-10-CM

## 2014-02-22 LAB — CBC
HEMATOCRIT: 31.1 % — AB (ref 36.0–46.0)
HEMOGLOBIN: 9.8 g/dL — AB (ref 12.0–15.0)
MCH: 26.6 pg (ref 26.0–34.0)
MCHC: 31.5 g/dL (ref 30.0–36.0)
MCV: 84.5 fL (ref 78.0–100.0)
Platelets: 204 10*3/uL (ref 150–400)
RBC: 3.68 MIL/uL — ABNORMAL LOW (ref 3.87–5.11)
RDW: 15.2 % (ref 11.5–15.5)
WBC: 6.1 10*3/uL (ref 4.0–10.5)

## 2014-02-22 LAB — GLUCOSE, CAPILLARY
GLUCOSE-CAPILLARY: 130 mg/dL — AB (ref 70–99)
Glucose-Capillary: 109 mg/dL — ABNORMAL HIGH (ref 70–99)

## 2014-02-22 LAB — BASIC METABOLIC PANEL
Anion gap: 12 (ref 5–15)
BUN: 9 mg/dL (ref 6–23)
CALCIUM: 8.4 mg/dL (ref 8.4–10.5)
CO2: 23 mEq/L (ref 19–32)
Chloride: 110 mEq/L (ref 96–112)
Creatinine, Ser: 0.66 mg/dL (ref 0.50–1.10)
GFR calc Af Amer: 82 mL/min — ABNORMAL LOW (ref >90)
GFR, EST NON AFRICAN AMERICAN: 71 mL/min — AB (ref >90)
Glucose, Bld: 103 mg/dL — ABNORMAL HIGH (ref 70–99)
Potassium: 3.7 mEq/L (ref 3.7–5.3)
SODIUM: 145 meq/L (ref 137–147)

## 2014-02-22 MED ORDER — RESOURCE THICKENUP CLEAR PO POWD
ORAL | Status: DC | PRN
  Filled 2014-02-22: qty 125

## 2014-02-22 NOTE — Progress Notes (Signed)
NUTRITION FOLLOW UP  Intervention:   Provide Magic Cup BID with lunch and dinner Discontinue TF order RD to continue to monitor  Nutrition Dx:   Inadequate oral intake related to inability to eat as evidenced by NPO; discontinued  New Nutrition Dx: Predicted sub optimal energy intake related to recent cerebral embolism/stroke as evidence by new onset dysphagia.   Goal:   Pt to meet >/= 90% of their estimated nutrition needs; unmet  Monitor:   TF initiation and tolerance, weight trends, labs   Assessment:   80 y.o. female who lives with her grandson who is her primary care giver. At baseline she is fairly functional, able to wash and bath herself, walk with walker, able to feed herself and groom herself. She was last seen normal at 12:35. When Grandson came back to check on her she was noted to be slumped over and not following commands. EMS was called and patient was brought to ED as a code stroke.  - Pt seen by SLP who recommended NPO due to severe risk of aspiration. 7/20: RD consulted for TF initiation and management.   7/23: Pt was started on Jevity 1.2 via NGT and got to goal rate of 55 ml/hr on 7/21. She then pulled NG tube out before 7/22 AM. Pt was re-evaluated by SLP 7/22 and underwent MBS; her diet was advanced to Dysphagia 2-nectar thick. Per nursing notes pt consumed 45% of dinner last night. Pt's weight has trended up 12 lbs since 7/16.  SLP working with patient at time of visit. Per SLP pt is doing well swallowing Dysphagia 2 textures but, pt seems to be getting fatigued with chewing. SLP reports pt had only consumed mashed potatoes from lunch tray prior to SLP arrival. Family reports pt likes ice cream and ate some last night. Plan to possibly downgrade diet to dysphagia 1/pureed foods. Per family at bedside pt was eating well PTA and she usually weighs between 130 and 140 lbs.   Height: Ht Readings from Last 1 Encounters:  02/15/14 5\' 4"  (1.626 m)    Weight Status:    Wt Readings from Last 1 Encounters:  02/22/14 140 lb 8 oz (63.73 kg)  02/15/14 128 lb  Re-estimated needs:  Kcal: 1400-1600  Protein: 70-80 g  Fluid: 1.4-1.6 L/day   Skin: stage 2 pressure ulcer on sacrum; +1 RUE edema  Diet Order: Dysphagia 2, nectar-thick liquids   Intake/Output Summary (Last 24 hours) at 02/22/14 1336 Last data filed at 02/21/14 1700  Gross per 24 hour  Intake    120 ml  Output      0 ml  Net    120 ml    Last BM: 7/22   Labs:   Recent Labs Lab 02/20/14 0409 02/21/14 0517 02/22/14 0527  NA 141 140 145  K 4.1 4.1 3.7  CL 110 110 110  CO2 19 20 23   BUN 14 12 9   CREATININE 0.70 0.69 0.66  CALCIUM 8.4 8.0* 8.4  GLUCOSE 108* 111* 103*    CBG (last 3)   Recent Labs  02/21/14 1952 02/22/14 0625 02/22/14 1142  GLUCAP 125* 109* 130*    Scheduled Meds: .  stroke: mapping our early stages of recovery book   Does not apply Once  . amantadine  100 mg Oral BID  . amLODipine  5 mg Oral Daily  . antiseptic oral rinse  15 mL Mouth Rinse BID  . antiseptic oral rinse  15 mL Mouth Rinse q12n4p  . aspirin  325 mg Oral Daily  . atorvastatin  40 mg Oral q1800  . chlorhexidine  15 mL Mouth Rinse BID  . heparin subcutaneous  5,000 Units Subcutaneous 3 times per day  . pantoprazole sodium  40 mg Per Tube Daily    Continuous Infusions: . sodium chloride 500 mL (02/22/14 0805)  . feeding supplement (JEVITY 1.2 CAL) 55 mL/hr at 02/20/14 2304    Ian Malkin RD, LDN Inpatient Clinical Dietitian Pager: [phone] After Hours Pager: [phone]

## 2014-02-22 NOTE — Progress Notes (Addendum)
[facility]Michelle, [place]RN.  IV and tele removed.  [facility] arrived to transport patient.  Lance BoschAnna Chun Sellen, RN

## 2014-02-22 NOTE — Progress Notes (Signed)
Speech Language Pathology Treatment: Dysphagia;Cognitive-Linquistic  Patient Details Name: Kayla Huynh MRN: [id number] DOB: 1914/02/06 Today's Date: 02/22/2014 Time: 1345-1410 SLP Time Calculation (min): 25 min  Assessment / Plan / Recommendation Clinical Impression  Pt appears to fatigue quickly with Dys 2 textures, and is primarily consuming Dys 1 textures at meals. Pt seen at the end of lunch meal, and demonstrated more prolonged mastication and increased pocketing as compared to previous date. Recommend to adjust diet to Dys 1 textures, nectar thick liquids to facilitate energy conservation. Family is in agreement.  Pt required Mod cues for initiation of self-feeding task and Max cues for awareness of food pocketed or anteriorly lost. Mod-Max cues were provided for following one-step commands. Pt primarily engages in interaction by making facial expressions in return to conversation. With Max multimodal cues, pt was able to count 1-5 with her fingers alongside SLP with ~75% accuracy.     HPI HPI: 80 y/o female who lives with her grandson who is her primary care giver.  Patient brought to ED as code stroke.  Dense right hemiplegia, right heminanopsia, and right sided neglect.    Pertinent Vitals n/a  SLP Plan  Continue with current plan of care    Recommendations Diet recommendations: Dysphagia 1 (puree);Nectar-thick liquid Liquids provided via: Cup;No straw Medication Administration: Crushed with puree Supervision: Patient able to self feed;Staff to assist with self feeding;Full supervision/cueing for compensatory strategies Compensations: Slow rate;Small sips/bites;Check for pocketing;Check for anterior loss Postural Changes and/or Swallow Maneuvers: Seated upright 90 degrees;Upright 30-60 min after meal              Oral Care Recommendations: Oral care BID Follow up Recommendations: Skilled Nursing facility Plan: Continue with current plan of care    GO      Maxcine HamLaura  Paiewonsky, M.A. CCC-SLP [phone]  Maxcine Hamaiewonsky, Francille Wittmann 02/22/2014, 2:22 PM

## 2014-02-22 NOTE — Consult Note (Signed)
Patient Kayla Huynh      DOB: [birth year]      PJK:[id number]     Consult Note from the Palliative Medicine Team at [facility] Requested by: Dr. Erlinda Hong     PCP: Juluis Pitch, MD Reason for Consultation: [place] and options.    Phone Number:[phone]  Assessment of patients Current state: I met today with daughter, Fraser Din, but the rest of the family is not willing to meet with me as they do not wish to discuss options since she passed her swallow study. I had a good conversation with Fraser Din who tells me she has had her own experience as a cancer survivor and has a good grasp for quality of life and how aggressive we should be with care depending on the quality of life we live with day to day. She is very realistic with her mother's age (turned 78 yo yesterday) and now this stroke that anything could happen at any time and she does wish for more comfort at this time and that her mother not suffer. She is also cautiously optimistic with her mother's improved mental awareness today and would like to pursue rehab to see what they are working with and how optimized we can get her mother's health. I am not sure that other family members share this view. I encouraged Pat to discuss with her mother if she gets well enough to engage in this sort of conversation and that completing HCPOA at some point may be a good idea.   Goals of Care: 1.  Code Status: PARTIAL: medication only. NO CPR/SHOCK/INTUBATION.    2. Scope of Treatment: Continue medical support and interventions and rehab. Limited code.    4. Disposition: SNF rehab.    3. Symptom Management:   1. Weakness: Continue medical management. PT/OT/SLP following.   4. Psychosocial: Emotional support provided to patient and daughter Fraser Din.    Patient Documents Completed or Given: Document Given Completed  Advanced Directives Pkt    MOST    DNR    Gone from My Sight    Hard Choices yes     Brief HPI: 80 yo female admitted with right  hemiplegia and right sided neglect and found to have acute left MCA infarct likely embolic. PMH significant for diabetes mellitus, hypertension, COPD, PVD.    ROS: Unable to assess - cannot understand speech.     PMH:  Past Medical History  Diagnosis Date  . Diabetes mellitus without complication   . Hypertension   . COPD (chronic obstructive pulmonary disease)   . PVD (peripheral vascular disease)   . Acid reflux      YKD:XIPJASN reviewed. No pertinent past surgical history. I have reviewed the [place] and SH and  If appropriate update it with new information. Allergies  Allergen Reactions  . Penicillins     Unknown  . Morphine And Related Hives and Rash   Scheduled Meds: .  stroke: mapping our early stages of recovery book   Does not apply Once  . amantadine  100 mg Oral BID  . amLODipine  5 mg Oral Daily  . antiseptic oral rinse  15 mL Mouth Rinse BID  . antiseptic oral rinse  15 mL Mouth Rinse q12n4p  . aspirin  325 mg Oral Daily  . atorvastatin  40 mg Oral q1800  . chlorhexidine  15 mL Mouth Rinse BID  . heparin subcutaneous  5,000 Units Subcutaneous 3 times per day  . pantoprazole sodium  40 mg Per  Tube Daily   Continuous Infusions: . sodium chloride 500 mL (02/22/14 0805)  . feeding supplement (JEVITY 1.2 CAL) 55 mL/hr at 02/20/14 2304   PRN Meds:.acetaminophen, hydrALAZINE, RESOURCE THICKENUP CLEAR, senna-docusate    BP 161/75  Pulse 68  Temp(Src) 97.5 F (36.4 C) (Oral)  Resp 20  Ht _0  (1.626 m)  Wt 63.73 kg (140 lb 8 oz)  BMI 24.10 kg/m2  SpO2 100%   PPS: 20%   Intake/Output Summary (Last 24 hours) at 02/22/14 1304 Last data filed at 02/21/14 1700  Gross per 24 hour  Intake    120 ml  Output      0 ml  Net    120 ml   LBM: 02/21/14                         Physical Exam:  General: NAD, frail, thin, elderly HEENT: [place]/AT, no JVD, moist mucous membranes without exudate Chest: No labored breathing, symmetric CVS: RRR Abdomen: Soft, NT,  ND Ext: No edema, warm to touch, right extremities weakened Neuro: More alert, able to follow commands  Labs: CBC    Component Value Date/Time   WBC 6.1 02/22/2014 0527   RBC 3.68* 02/22/2014 0527   HGB 9.8* 02/22/2014 0527   HCT 31.1* 02/22/2014 0527   PLT 204 02/22/2014 0527   MCV 84.5 02/22/2014 0527   MCH 26.6 02/22/2014 0527   MCHC 31.5 02/22/2014 0527   RDW 15.2 02/22/2014 0527   LYMPHSABS 1.4 02/19/2014 0607   MONOABS 0.7 02/19/2014 0607   EOSABS 0.1 02/19/2014 0607   BASOSABS 0.0 02/19/2014 0607    BMET    Component Value Date/Time   NA 145 02/22/2014 0527   K 3.7 02/22/2014 0527   CL 110 02/22/2014 0527   CO2 23 02/22/2014 0527   GLUCOSE 103* 02/22/2014 0527   BUN 9 02/22/2014 0527   CREATININE 0.66 02/22/2014 0527   CALCIUM 8.4 02/22/2014 0527   GFRNONAA 71* 02/22/2014 0527   GFRAA 82* 02/22/2014 0527    CMP     Component Value Date/Time   NA 145 02/22/2014 0527   K 3.7 02/22/2014 0527   CL 110 02/22/2014 0527   CO2 23 02/22/2014 0527   GLUCOSE 103* 02/22/2014 0527   BUN 9 02/22/2014 0527   CREATININE 0.66 02/22/2014 0527   CALCIUM 8.4 02/22/2014 0527   PROT 6.6 02/15/2014 1446   ALBUMIN 3.3* 02/15/2014 1446   AST 12 02/15/2014 1446   ALT 7 02/15/2014 1446   ALKPHOS 57 02/15/2014 1446   BILITOT 0.2* 02/15/2014 1446   GFRNONAA 71* 02/22/2014 0527   GFRAA 82* 02/22/2014 0527     Time In Time Out Total Time Spent with Patient Total Overall Time  1200 1315 82mn 732m    Greater than 50%  of this time was spent counseling and coordinating care related to the above assessment and plan.  AlVinie SillNP Palliative Medicine Team Pager # [phone]-F 8a-5p) Team Phone # [phone]/Weekends)

## 2014-02-22 NOTE — Discharge Summary (Signed)
Stroke Discharge Summary  Patient ID: Kayla Huynh    l   MRN: [id number]      DOB: 1913-12-17  Date of Admission: 02/15/2014 Date of Discharge: 02/22/2014  Attending Physician:  Marvel PlanJindong Avery Klingbeil, MD, Stroke MD  Consulting Physician(s):   Treatment Team:  [facility] Stroke, MD Palliative [facility]   Patient's PCP:  Dorothey BasemanBRONSTEIN,DAVID, MD  Discharge Diagnoses:   Stroke: Large L MCA infarct, s/p IV tPA, likely embolic, underlying etiology unknown    Resultant global aphasia, right visual field cut, right hemiparesis, and dysphagia.   Bradycardia  Arrhythmia  Hypertension   Hyperlipidemia   Diabetes   Hypokalemia, resolved  Malnutrition of moderate degree,  BMI: Body mass index is 24.1 kg/(m^2).  Past Medical History  Diagnosis Date  . Diabetes mellitus without complication   . Hypertension   . COPD (chronic obstructive pulmonary disease)   . PVD (peripheral vascular disease)   . Acid reflux    History reviewed. No pertinent past surgical history.    Medication List    ASK your doctor about these medications       ACIDOPHILUS PO  Take 1 tablet by mouth daily.     amLODipine 5 MG tablet  Commonly known as:  NORVASC  Take 5 mg by mouth daily.     aspirin EC 81 MG tablet  Take 81 mg by mouth daily.     budesonide 180 MCG/ACT inhaler  Commonly known as:  PULMICORT  Inhale 2 puffs into the lungs daily.     calcium carbonate 600 MG Tabs tablet  Commonly known as:  OS-CAL  Take 600 mg by mouth daily.     levothyroxine 88 MCG tablet  Commonly known as:  SYNTHROID, LEVOTHROID  Take 88 mcg by mouth daily before breakfast.     metFORMIN 500 MG tablet  Commonly known as:  GLUCOPHAGE  Take 500 mg by mouth 2 (two) times daily with a meal.     mirtazapine 15 MG tablet  Commonly known as:  REMERON  Take 7.5 mg by mouth at bedtime.     omeprazole 20 MG capsule  Commonly known as:  PRILOSEC  Take 20 mg by mouth daily.     VISINE OP  Place 2 drops into both eyes daily.         LABORATORY STUDIES CBC    Component Value Date/Time   WBC 6.1 02/22/2014 0527   RBC 3.68* 02/22/2014 0527   HGB 9.8* 02/22/2014 0527   HCT 31.1* 02/22/2014 0527   PLT 204 02/22/2014 0527   MCV 84.5 02/22/2014 0527   MCH 26.6 02/22/2014 0527   MCHC 31.5 02/22/2014 0527   RDW 15.2 02/22/2014 0527   LYMPHSABS 1.4 02/19/2014 0607   MONOABS 0.7 02/19/2014 0607   EOSABS 0.1 02/19/2014 0607   BASOSABS 0.0 02/19/2014 0607   CMP    Component Value Date/Time   NA 145 02/22/2014 0527   K 3.7 02/22/2014 0527   CL 110 02/22/2014 0527   CO2 23 02/22/2014 0527   GLUCOSE 103* 02/22/2014 0527   BUN 9 02/22/2014 0527   CREATININE 0.66 02/22/2014 0527   CALCIUM 8.4 02/22/2014 0527   PROT 6.6 02/15/2014 1446   ALBUMIN 3.3* 02/15/2014 1446   AST 12 02/15/2014 1446   ALT 7 02/15/2014 1446   ALKPHOS 57 02/15/2014 1446   BILITOT 0.2* 02/15/2014 1446   GFRNONAA 71* 02/22/2014 0527   GFRAA 82* 02/22/2014 0527   COAGS Lab Results  Component Value Date  INR 1.11 02/15/2014   Lipid Panel    Component Value Date/Time   CHOL 173 02/16/2014 0245   TRIG 189* 02/16/2014 0245   HDL 33* 02/16/2014 0245   CHOLHDL 5.2 02/16/2014 0245   VLDL 38 02/16/2014 0245   LDLCALC 102* 02/16/2014 0245   HgbA1C  Lab Results  Component Value Date   HGBA1C 6.1* 02/16/2014   Cardiac Panel (last 3 results) No results found for this basename: CKTOTAL, CKMB, TROPONINI, RELINDX,  in the last 72 hours Urinalysis    Component Value Date/Time   COLORURINE YELLOW 02/15/2014 2215   APPEARANCEUR CLEAR 02/15/2014 2215   LABSPEC 1.014 02/15/2014 2215   PHURINE 7.0 02/15/2014 2215   GLUCOSEU NEGATIVE 02/15/2014 2215   HGBUR NEGATIVE 02/15/2014 2215   BILIRUBINUR NEGATIVE 02/15/2014 2215   KETONESUR NEGATIVE 02/15/2014 2215   PROTEINUR 30* 02/15/2014 2215   UROBILINOGEN 1.0 02/15/2014 2215   NITRITE NEGATIVE 02/15/2014 2215   LEUKOCYTESUR NEGATIVE 02/15/2014 2215   Urine Drug Screen     Component Value Date/Time   LABOPIA NONE DETECTED  02/15/2014 2215   COCAINSCRNUR NONE DETECTED 02/15/2014 2215   LABBENZ NONE DETECTED 02/15/2014 2215   AMPHETMU NONE DETECTED 02/15/2014 2215   THCU NONE DETECTED 02/15/2014 2215   LABBARB NONE DETECTED 02/15/2014 2215    Alcohol Level    Component Value Date/Time   ETH <11 02/15/2014 1446     SIGNIFICANT DIAGNOSTIC STUDIES CT of the brain 02/15/2014 Atrophy and chronic small vessel ischemic changes. Probable hyperdense left MCA and early loss of gray-white discrimination in the insula and posterior frontal region on the left. No hemorrhage.  MRI of the brain 02/15/14 Large acute nonhemorrhagic left middle cerebral artery distribution infarct involves portions of the left temporal lobe, left frontal lobe, left parietal lobe, left subinsular region, left peri operculum region and left lenticular nucleus/ caudate head. Mild local mass effect upon the left lateral ventricle. There is minimal bowing of the septum to the right. Scattered small areas of blood breakdown products separate from the left hemispheric infarct and most notable in the right parietal-occipital lobe consistent with remote hemorrhagic infarct. Small vessel disease type changes.  MRA Head 02/15/14 Thrombosed left middle cerebral artery branch vessels. No high-grade stenosis of the left middle cerebral artery bifurcation, M1 segment left middle cerebral artery or left carotid terminus. No significant narrowing of either internal carotid artery cavernous or supraclinoid segment. Mild to moderate narrowing A1 segment right anterior cerebral artery. Mild narrowing M1 segment right middle cerebral artery. Right middle cerebral artery branch vessel irregularity. Abnormal appearance of the left vertebral artery which may reflect proximal stenosis with poor distal flow.  Carotid Doppler 02/17/14 Prelim <39% stenosis bilat  2D Echocardiogram 02/16/14 EF 55-60%, no definite cardiac source of emboli identified  CXR 02/15/2014 1. Cardiomegaly and trace  bilateral effusions without definite evidence of edema. 2. Hyperexpanded lungs and bibasilar atelectasis without discrete focal airspace opacities to suggest pneumonia.  EKG 02/19/14 Sinus brady. However, tele strip really suspicious for afib but cardiology does not think it is the case.    History of Present Illness  Kayla Huynh is an 80 y.o. female who lives with her grandson who is her primary care giver. At baseline she is fairly functional, able to wash and bath herself, walk with walker, able to feed herself and groom herself. She was last seen normal at 12:35 02/15/2014. When Grandson came back to check on her she was noted to be slumped over and not following commands.  EMS was called and patient was brought to ED as a code stroke. On arrival she had a dense right right hemiplegia, right hemianopsia and right sided neglect. CT brain showed a probable hyperdense left MCA sign and early loss of gray-white discrimination in the insula and posterior frontal region on the left Discussion was had with family and family decided they would like tPA to be given as she is within the 3 hour window and functional at home. NIHSS: 22. Patient was administered TPA. She was admitted to the neuro ICU for further evaluation and treatment.   Hospital Course Ms. Kayla Huynh is a 80 y.o. female presenting with acute onset of aphasia, right hemiplegia, right hemianopsia. Status post IV t-PA 02/15/2014 at 1522. Patient with large L MCA infarct. Infarct felt to be Embolic, underlying etiology unknown, highly suspicious for atrial fibrillation, but not able to confirm. On aspirin 81 mg orally every day prior to admission. Now on ASA 325mg  daily for secondary stroke prevention. Patient with resultant global aphasia, right visual field cut, right hemiparesis, and dysphagia.   Stroke: Large L MCA infarct, likely embolic, underlying etiology unknown  Carotid US <39% stenosis bilat  2D Echo EF 55-60%, no definite cardiac  source of emboli identified  EKG 02/19/14 sinus brady but tele suspicious for afib  Cardiology contacted but does not think can be diagnosed with afib  On ASA 325mg  daily  Amantadine added to increase alertness  LDL 102, add lipitor 40mg , goal < 70  HgbA1c 6.1 D/Cd TEE and Loop because not felt to be good candidate for anticoagulation Passed swallow and on dysphagia diet 2. Currently speech assessing swallow at bedside  [facility] care consult; family have delayed meeting as they felt patient was doing better. Bradycardia, arrhythmia, Hypertension  Norvasc at home, resumed norvasc 5 mg in hopsital Tele suspicious for afib but cardiology does not think it is the case yet TEE and Loop not completed because patient not a good candidate for long-term anticoagulation Hyperlipidemia  LDL 102, put on lipitor 40mg  daily  goal LDL < 70  Diabetes  HgbA1c 6.1, meeting goal < 6.5  on metformin at home  Hypokalemia  Supplement KCl through NG tube.  K 02/22/14 WNL at 3.7 Dispo: SNF.  Code Status: limited code with medication only, no compressions or intubation.  Discharge Exam  Blood pressure 166/52, pulse 59, temperature 97.5 F (36.4 C), temperature source Oral, resp. rate 20, height 5\' 4"  (1.626 m), weight 63.73 kg (140 lb 8 oz), SpO2 99.00%.  Frail elderly lady not in distress. Afebrile. Head is nontraumatic. Neck is supple without bruit. Hearing is normal. Cardiac exam no murmur or gallop, regular heart rhythm but brady. Lungs are clear to auscultation. Distal pulses are well felt.  Neurological Exam: Alert and interactive. R hemianopsia. Pupils 3 mm irregular reactive. No verbal output, follows some commands. Right lower facial weakness. Tongue midline. Motor system exam reveals right UE proximal withdraws, distal wiggles fingers on command, RLE wiggles toes on command. LUE 4/5 proximally, weak grip. LLE wiggles toes on command. Right plantar upgoing left downgoing. Sensation not cooperative to  test but respond to pain stimulation. Coordination and gait were not tested  Discharge Diet   Dysphagia 1 nectar thick liquids  Discharge Plan    Disposition:  Discharge to skilled nursing facility for ongoing PT, OT and ST.  aspirin 325 mg orally every day for secondary stroke prevention.  Ongoing risk factor control by SNF Physician.  Follow-up by SNF MD  Follow-up with Dr. Marvel Plan, Stroke Clinic in 2 months.  35 minutes were spent preparing discharge.  Annie Main, MSN, RN, ANVP-BC, ANP-BC, [facility] Pager: [phone] 02/22/2014 3:38 PM  Signed I, the attending vascular neurologist, have personally obtained a history, examined the patient, evaluated laboratory data, individually viewed imaging studies, and formulated the assessment and plan of care.  I have made any additions or clarifications directly to the above note and agree with the findings and plan as currently documented.   Marvel Plan, MD PhD 02/22/2014 7:04 PM

## 2014-02-22 NOTE — Clinical Social Work Note (Signed)
[facility] Discharge packet complete and placed on pt's shadow chart. CSW confirmed discharge with pt's daughter, Joylene JohnLibby Crosby. Pt's daughter stated concern for "rushed" discharge on 02/22/2014 and requested pt be discharged on 02/23/2014. CSW and RN spoke with pt's family regarding pt's medical stability and readiness for discharge. Pt's stated understanding. CSW has arranged for EMS transportation via [facility]. RN has been provided with number for report.  Marcelline DeistEmily Gautam Langhorst, MSW, [facility] Licensed Clinical Social Worker [phone] and [phone] [phone]

## 2014-02-22 NOTE — Progress Notes (Signed)
Stroke Team Progress Note  HISTORY Kayla Huynh is an 80 y.o. female who lives with her grandson who is her primary care giver. At baseline she is fairly functional, able to wash and bath herself, walk with walker, able to feed herself and groom herself. She was last seen normal at 12:35 02/15/2014. When Grandson came back to check on her she was noted to be slumped over and not following commands. EMS was called and patient was brought to ED as a code stroke. On arrival she had a dense right right hemiplegia, right hemianopsia and right sided neglect. CT brain showed a probable hyperdense left MCA sign and early loss of gray-white discrimination in the insula and posterior frontal region on the left Discussion was had with family and family decided they would like tPA to be given as she is within the 3 hour window and functional at home. NIHSS: 22. Patient was administered TPA. She was admitted to the neuro ICU for further evaluation and treatment.  SUBJECTIVE Family at bedside, they report she would really like a cup of coffee.  More awake and alert, followed only simple commands of "close eyes" "open eyes".  OBJECTIVE Most recent Vital Signs: Filed Vitals:   02/22/14 0321 02/22/14 0500 02/22/14 0642 02/22/14 0939  BP: 155/83  151/57 161/75  Pulse: 66  61 68  Temp: 97.5 F (36.4 C)  97.3 F (36.3 C) 97.5 F (36.4 C)  TempSrc:   Oral Oral  Resp: 18  18 20   Height:      Weight:  63.73 kg (140 lb 8 oz)    SpO2: 100%  98% 100%   CBG (last 3)   Recent Labs  02/21/14 1952 02/22/14 0625 02/22/14 1142  GLUCAP 125* 109* 130*   IV Fluid Intake:   . sodium chloride 500 mL (02/22/14 0805)  . feeding supplement (JEVITY 1.2 CAL) 55 mL/hr at 02/20/14 2304    MEDICATIONS  .  stroke: mapping our early stages of recovery book   Does not apply Once  . amantadine  100 mg Oral BID  . amLODipine  5 mg Oral Daily  . antiseptic oral rinse  15 mL Mouth Rinse BID  . antiseptic oral rinse  15 mL  Mouth Rinse q12n4p  . aspirin  325 mg Oral Daily  . atorvastatin  40 mg Oral q1800  . chlorhexidine  15 mL Mouth Rinse BID  . heparin subcutaneous  5,000 Units Subcutaneous 3 times per day  . pantoprazole sodium  40 mg Per Tube Daily   PRN:  acetaminophen, hydrALAZINE, RESOURCE THICKENUP CLEAR, senna-docusate  Diet:  Dysphagia 1 nectar thick liquids Activity:  Up with assistance DVT Prophylaxis:  Heparin 5000 units sq tid   CLINICALLY SIGNIFICANT STUDIES Basic Metabolic Panel:   Recent Labs Lab 02/21/14 0517 02/22/14 0527  NA 140 145  K 4.1 3.7  CL 110 110  CO2 20 23  GLUCOSE 111* 103*  BUN 12 9  CREATININE 0.69 0.66  CALCIUM 8.0* 8.4   Liver Function Tests:   Recent Labs Lab 02/15/14 1446  AST 12  ALT 7  ALKPHOS 57  BILITOT 0.2*  PROT 6.6  ALBUMIN 3.3*   CBC:   Recent Labs Lab 02/15/14 1446  02/19/14 0607  02/21/14 0517 02/22/14 0527  WBC 6.6  --  7.6  < > 7.7 6.1  NEUTROABS 4.0  --  5.4  --   --   --   HGB 10.8*  < > 10.4*  < >  10.4* 9.8*  HCT 34.4*  < > 32.7*  < > 32.2* 31.1*  MCV 83.7  --  82.4  < > 82.1 84.5  PLT 190  --  201  < > 188 204  < > = values in this interval not displayed. Coagulation:   Recent Labs Lab 02/15/14 1446  LABPROT 14.3  INR 1.11   Cardiac Enzymes: No results found for this basename: CKTOTAL, CKMB, CKMBINDEX, TROPONINI,  in the last 168 hours Urinalysis:   Recent Labs Lab 02/15/14 2215  COLORURINE YELLOW  LABSPEC 1.014  PHURINE 7.0  GLUCOSEU NEGATIVE  HGBUR NEGATIVE  BILIRUBINUR NEGATIVE  KETONESUR NEGATIVE  PROTEINUR 30*  UROBILINOGEN 1.0  NITRITE NEGATIVE  LEUKOCYTESUR NEGATIVE   Lipid Panel    Component Value Date/Time   CHOL 173 02/16/2014 0245   TRIG 189* 02/16/2014 0245   HDL 33* 02/16/2014 0245   CHOLHDL 5.2 02/16/2014 0245   VLDL 38 02/16/2014 0245   LDLCALC 102* 02/16/2014 0245   HgbA1C  Lab Results  Component Value Date   HGBA1C 6.1* 02/16/2014    Urine Drug Screen:     Component Value  Date/Time   LABOPIA NONE DETECTED 02/15/2014 2215   COCAINSCRNUR NONE DETECTED 02/15/2014 2215   LABBENZ NONE DETECTED 02/15/2014 2215   AMPHETMU NONE DETECTED 02/15/2014 2215   THCU NONE DETECTED 02/15/2014 2215   LABBARB NONE DETECTED 02/15/2014 2215    Alcohol Level:   Recent Labs Lab 02/15/14 1446  ETH <11     CT of the brain  02/15/2014    Atrophy and chronic small vessel ischemic changes.  Probable hyperdense left MCA and early loss of gray-white discrimination in the insula and posterior frontal region on the left. No hemorrhage.                                                                                                                                                                                 MRI of the brain  02/15/14  Large acute nonhemorrhagic left middle cerebral artery distribution infarct involves portions of the left temporal lobe, left frontal lobe, left parietal lobe, left subinsular region, left peri operculum region and left lenticular nucleus/ caudate head. Mild local mass effect upon the left lateral ventricle. There is minimal bowing of the septum to the right. Scattered small areas of blood breakdown products separate from the left hemispheric infarct and most notable in the right parietal-occipital lobe consistent with remote hemorrhagic infarct. Small vessel disease type changes.  MRA Head 02/15/14 Thrombosed left middle cerebral artery branch vessels. No high-grade stenosis of the left middle cerebral artery bifurcation, M1 segment left middle cerebral artery or left carotid terminus. No significant narrowing  of either internal carotid artery cavernous or supraclinoid segment. Mild to moderate narrowing A1 segment right anterior cerebral artery. Mild narrowing M1 segment right middle cerebral artery. Right middle cerebral artery branch vessel irregularity. Abnormal appearance of the left vertebral artery which may reflect proximal stenosis with poor distal  flow.  Carotid Doppler  02/17/14 Prelim <39% stenosis bilat  2D Echocardiogram  02/16/14 EF 55-60%, no definite cardiac source of emboli identified  CXR  02/15/2014   1. Cardiomegaly and trace bilateral effusions without definite evidence of edema. 2. Hyperexpanded lungs and bibasilar atelectasis without discrete focal airspace opacities to suggest pneumonia.   EKG  02/19/14 Sinus brady. However, tele strip really suspicious for afib but cardiology does not think it is the case.  Therapy Recommendations SNF  Physical Exam   Frail elderly lady not in distress. Afebrile. Head is nontraumatic. Neck is supple without bruit. Hearing is normal. Cardiac exam no murmur or gallop, regular heart rhythm but brady. Lungs are clear to auscultation. Distal pulses are well felt. Neurological Exam: Alert and interactive. R hemianopsia.  Pupils 3 mm irregular reactive. No verbal output, follows central commands of eye opening and eye closure, not other commands. Right lower facial weakness. Tongue midline. Motor system exam reveals right UE proximal withdraws, distal wiggles fingers, RLE wiggles toes on command. LUE 4/5 proximally, weak grip. LLE wiggles toes on command.  Right plantar upgoing left downgoing. Sensation not cooperative to test but respond to pain stimulation. Coordination and gait were not tested  ASSESSMENT Ms. Kayla Huynh is a 80 y.o. female presenting with acute onset of aphasia, right hemiplegia, right hemianopsia. Status post IV t-PA 02/15/2014 at 1522. Patient with large L MCA infarct. Infarct felt to be  Embolic, underlying etiology unknown.  On aspirin 81 mg orally every day prior to admission. Now on ASA 325mg  daily for secondary stroke prevention. Patient with resultant global aphasia, right visual field cut, right hemiparesis, and dysphagia.  Stroke: Large L MCA infarct, likely embolic, underlying etiology unknown  Carotid US <39% stenosis bilat  2D Echo EF 55-60%, no definite cardiac  source of emboli identified  EKG 02/19/14 sinus brady but tele suspicious for afib  Cardiology contacted but does not think can be diagnosed with afib yet  On ASA 325mg  daily for now  Amantadine to increase alertness  LDL 102, add lipitor 40mg , goal < 70  HgbA1c 6.1  D/Cd TEE and Loop because not felt to be good candidate for anticoagulation. Family in agreement that we d/c TEE and loop recorder placement.  Passed swallow and on dysphagia diet 2. Currently speech assessing swallow at bedside  Family have delayed meeting with Palliative care as pt is doing better today. Palliative to reach out to them again.  Bradycardia, arrhythmia, Hypertension  Norvasc at home  Added norvasc 5mg .   BP 109-161/75-84 last 24 hours  Tele suspicious for afib but cardiology does not think it is the case yet  D/Cd TEE and Loop because not felt to be good candidate for long-term anticoagulation  Hyperlipidemia  LDL 102, put on lipitor 40mg  daily  goal LDL < 70   Diabetes  HgbA1c 6.1, meeting goal < 6.5  on metformin at home  BG 96-154 in last 24 H  Hypokalemia  Supplement KCl through NG tube.  K 02/21/14 WNL at 4.1  Dispo:  SNF. Medically ready for discharge. Await bed. Code Status: limited code with medication only, no compressions or intubation.  Hospital day # 7  SHARON BIBY, MSN,  RN, ANVP-BC, ANP-BC, GNP-BC [facility] Pager: [phone] 02/22/2014 2:01 PM  SIGNED I, the attending vascular neurologist, have personally obtained a history, examined the patient, evaluated laboratory data, individually viewed imaging studies, and formulated the assessment and plan of care.  I have made any additions or clarifications directly to the above note and agree with the findings and plan as currently documented.   Marvel Plan, MD PhD 02/22/2014 7:04 PM    To contact Stroke Continuity provider, please refer to [email or web address]. After hours, contact General  Neurology

## 2014-02-26 ENCOUNTER — Encounter: Payer: Self-pay | Admitting: Adult Health

## 2014-02-26 ENCOUNTER — Non-Acute Institutional Stay (SKILLED_NURSING_FACILITY): Payer: Medicare Other | Admitting: Adult Health

## 2014-02-26 DIAGNOSIS — I10 Essential (primary) hypertension: Secondary | ICD-10-CM

## 2014-02-26 DIAGNOSIS — IMO0001 Reserved for inherently not codable concepts without codable children: Secondary | ICD-10-CM | POA: Insufficient documentation

## 2014-02-26 DIAGNOSIS — E1165 Type 2 diabetes mellitus with hyperglycemia: Secondary | ICD-10-CM

## 2014-02-26 DIAGNOSIS — K219 Gastro-esophageal reflux disease without esophagitis: Secondary | ICD-10-CM | POA: Insufficient documentation

## 2014-02-26 DIAGNOSIS — I6340 Cerebral infarction due to embolism of unspecified cerebral artery: Secondary | ICD-10-CM

## 2014-02-26 DIAGNOSIS — J438 Other emphysema: Secondary | ICD-10-CM

## 2014-02-26 DIAGNOSIS — J449 Chronic obstructive pulmonary disease, unspecified: Secondary | ICD-10-CM | POA: Insufficient documentation

## 2014-02-26 DIAGNOSIS — E039 Hypothyroidism, unspecified: Secondary | ICD-10-CM | POA: Insufficient documentation

## 2014-02-26 DIAGNOSIS — E440 Moderate protein-calorie malnutrition: Secondary | ICD-10-CM

## 2014-02-26 DIAGNOSIS — E785 Hyperlipidemia, unspecified: Secondary | ICD-10-CM | POA: Insufficient documentation

## 2014-02-26 NOTE — Progress Notes (Signed)
Patient ID: Kayla Huynh, female   DOB: Dec 11, 1913, 80 y.o.   MRN: [id number]               PROGRESS NOTE  DATE: 02/26/2014  FACILITY: Nursing Home [facility]  LEVEL OF CARE: SNF (31)  Acute Visit  CHIEF COMPLAINT:  Follow-up Hospitalization  HISTORY OF PRESENT ILLNESS: This is a 80 year old female who has [facility] with Left MCA infarct S/P IV tPA likely embolic, with resultant global aphasia, right visual field cut, right hemiparesis and dysphagia. She has been admitted for a short-term rehabilitation.  REASSESSMENT OF ONGOING PROBLEM(S):  HTN: Pt 's HTN remains stable.  Denies CP, sob, DOE, pedal edema, headaches, dizziness or visual disturbances.  No complications from the medications currently being used.  Last BP : 103/55  HYPERLIPIDEMIA: No medications presently being used due to advance age. 7/15 fasting lipid panel showed : Cholesterol 173 triglycerides 189 HDL 33  DM:pt's DM remains stable.  Pt denies polyuria, polydipsia, polyphagia, changes in vision or hypoglycemic episodes.  No complications noted from the medication presently being used.   7/15 hemoglobin A1c is: 6.1  PAST MEDICAL HISTORY : Reviewed.  No changes/see problem list  CURRENT MEDICATIONS: Reviewed per MAR/see medication list  REVIEW OF SYSTEMS: unable to obtain due to global aphasia  PHYSICAL EXAMINATION  GENERAL: no acute distress,thin body habitus EYES: conjunctivae normal, sclerae normal, normal eye lids NECK: supple, trachea midline, no neck masses, no thyroid tenderness, no thyromegaly LYMPHATICS: no LAN in the neck, no supraclavicular LAN RESPIRATORY: breathing is even & unlabored, BS CTAB CARDIAC: RRR, no murmur,no extra heart sounds, no edema GI: abdomen soft, normal BS, no masses, no tenderness, no hepatomegaly, no splenomegaly EXTREMITIES: right hemiparesis PSYCHIATRIC: the patient is alert & oriented to  person, affect & behavior appropriate  LABS/RADIOLOGY: 02/22/14  WBC 7.7 hemoglobin 10.4 hematocrit 32.2 sodium 145 potassium 3.7 glucose 103 BUN 9 creatinine 0.66 calcium 8.4 02/19/14  TSH 1.530 02/16/14  hemoglobin A1c 6.1 cholesterol 173 triglycerides 189 HDL 33 LDL 102  EXAM:  MRI HEAD WITHOUT CONTRAST  MRA HEAD WITHOUT CONTRAST  TECHNIQUE:  Multiplanar, multiecho pulse sequences of the brain and surrounding  structures were obtained without intravenous contrast. Angiographic  images of the head were obtained using MRA technique without  contrast.  COMPARISON: 02/16/2014 and 02/15/2014 CT. No comparison MR.  FINDINGS:  MRI HEAD FINDINGS  Large acute nonhemorrhagic left middle cerebral artery distribution  infarct involves portions of the left temporal lobe, left frontal  lobe, left parietal lobe, left subinsular region, left peri  operculum region and left lenticular nucleus/ caudate head. Mild  local mass effect upon the left lateral ventricle. There is minimal  bowing of the septum to the right.  Scattered small areas of blood breakdown products separate from the  left hemispheric infarct and most notable in the right  parietal-occipital lobe consistent with remote hemorrhagic infarct.  Small vessel disease type changes.  Atrophy without hydrocephalus.  Abnormal appearance left middle cerebral artery. Please see below.  Mild cervical spondylotic changes C4-5 level.  Nonspecific left parietal 1.4 cm scalp lesion.  MRA HEAD FINDINGS  Thrombosed left middle cerebral artery branch vessels. No high-grade  stenosis of the left middle cerebral artery bifurcation, M1 segment  left middle cerebral artery or left carotid terminus.  No significant narrowing of either internal carotid artery cavernous  or supraclinoid segment.  Mild to moderate narrowing A1  segment right anterior cerebral  artery. Mild narrowing M1 segment right middle cerebral artery.  Right middle cerebral artery  branch vessel irregularity.  Abnormal appearance of the left vertebral artery which may reflect  proximal stenosis with poor distal flow.  No significant stenosis distal right vertebral artery.  Nonvisualized right posterior inferior cerebellar artery and left  anterior inferior cerebellar artery.  Mild to moderate narrowing portions of the basilar artery.  Fetal type contribution to the right posterior cerebral artery.  Posterior cerebral artery distal branch vessel narrowing and  irregularity bilaterally.  No aneurysm noted  IMPRESSION:  MRI HEAD:  Large acute nonhemorrhagic left middle cerebral artery distribution  infarct with local mass effect upon the left lateral ventricle.  Scattered small areas of blood breakdown products separate from the  left hemispheric infarct and most notable in the right  parietal-occipital lobe consistent with remote hemorrhagic infarct.  MRA HEAD:  Thrombosed left middle cerebral artery branch vessels. No high-grade  stenosis of the left middle cerebral artery bifurcation, M1 segment  left middle cerebral artery or left carotid terminus.  No significant narrowing of either internal carotid artery cavernous  or supraclinoid segment.  Mild to moderate narrowing A1 segment right anterior cerebral  artery. Mild narrowing M1 segment right middle cerebral artery.  Right middle cerebral artery branch vessel irregularity.  Abnormal appearance of the left vertebral artery which may reflect  proximal stenosis with poor distal flow.  No significant stenosis distal right vertebral artery.  Nonvisualized right posterior inferior cerebellar artery and left  anterior inferior cerebellar artery.  Mild to moderate narrowing portions of the basilar artery.    ASSESSMENT/PLAN:  Cerebral embolism with cerebral infarction - for rehabilitation Hypertension - well controlled; continue Norvasc Hyperlipidemia - on no medication due to advance age Diabetes mellitus,  type II - well controlled; continue metformin Malnutrition of moderate degree - continue supplementation  COPD - stable; continue Pulmicort Hypothyroidism - stable; continue Synthroid GERD - continue Prilosec   CPT CODE: 16109  Ella Bodo - NP [facility] [phone]

## 2014-02-27 ENCOUNTER — Non-Acute Institutional Stay (SKILLED_NURSING_FACILITY): Payer: Medicare Other | Admitting: Internal Medicine

## 2014-02-27 DIAGNOSIS — E039 Hypothyroidism, unspecified: Secondary | ICD-10-CM

## 2014-02-27 DIAGNOSIS — E1059 Type 1 diabetes mellitus with other circulatory complications: Secondary | ICD-10-CM

## 2014-02-27 DIAGNOSIS — I6340 Cerebral infarction due to embolism of unspecified cerebral artery: Secondary | ICD-10-CM

## 2014-02-27 DIAGNOSIS — I10 Essential (primary) hypertension: Secondary | ICD-10-CM

## 2014-03-01 NOTE — Progress Notes (Signed)
HISTORY & PHYSICAL  DATE: 02/27/2014   FACILITY: [facility]  LEVEL OF CARE: SNF (31)  ALLERGIES:  Allergies  Allergen Reactions  . Morphine And Related Other (See Comments)    HALLUCINATIONS  . Penicillins Rash    CHIEF COMPLAINT:  Manage CVA, diabetes mellitus and hypothyroidism  HISTORY OF PRESENT ILLNESS: 80 year old Caucasian female was hospitalized secondary to a CVA. After Hospitalization she is admitted to this facility for short-term rehabilitation.  CVA: The patient's CVA remains stable.  staff deny new neurologic symptoms such as numbness, tingling, weakness, or visual disturbances.  No complications reported from the medications currently being used. Patient presented with acute onset Aphasia, right hemiplegia, right hemianopsia. She is status post IV- TPA. She was diagnosed with a large left MCA infarct, felt to be embolic, underlying etiology unknown. Her aspirin was increased to 325 mg daily. She now has resultant global aphasia, right visual field cut, right hemiparesis and dysphagia. Patient  Unable to provide any history due to aphasia.  DM:pt's DM remains stable.  Staff deny polyuria, polydipsia, polyphagia, changes in vision or hypoglycemic episodes.  No complications noted from the medication presently being used.  Last hemoglobin A1c is: 6.1.  HYPOTHYROIDISM: The hypothyroidism remains stable. No complications noted from the medications presently being used.  The patient denies fatigue or constipation.  Last TSH not available.  PAST MEDICAL HISTORY :  Past Medical History  Diagnosis Date  . HBP (high blood pressure)   . Arthritis   . Diabetes   . Swelling     PAST SURGICAL HISTORY: Past Surgical History  Procedure Laterality Date  . Gallbladder surgery    . Abdominal hysterectomy    . Appendectomy      SOCIAL HISTORY:  reports that she has quit smoking. She has never used smokeless tobacco. She reports that she does not  drink alcohol or use illicit drugs.  FAMILY HISTORY: Non per record  CURRENT MEDICATIONS: Reviewed per MAR/see medication list  REVIEW OF SYSTEMS: Unobtainable due to aphasia  PHYSICAL EXAMINATION  VS:  See VS section  GENERAL: no acute distress, normal body habitus EYES: conjunctivae normal, sclerae normal, normal eye lids MOUTH/THROAT: lips without lesions,no lesions in the mouth,tongue is without lesions,uvula elevates in midline NECK: supple, trachea midline, no neck masses, no thyroid tenderness, no thyromegaly LYMPHATICS: no LAN in the neck, no supraclavicular LAN RESPIRATORY: breathing is even & unlabored, BS CTAB CARDIAC: RRR, no murmur,no extra heart sounds, no edema GI:  ABDOMEN: abdomen soft, normal BS, no masses, no tenderness  LIVER/SPLEEN: no hepatomegaly, no splenomegaly MUSCULOSKELETAL: HEAD: normal to inspection  EXTREMITIES: LEFT UPPER EXTREMITY: minimal range of motion, decreased strength & tone RIGHT UPPER EXTREMITY:  Paralyzed LEFT LOWER EXTREMITY:  Unable to assess RIGHT LOWER EXTREMITY:  Paralyzed PSYCHIATRIC: the patient is alert & oriented to person, affect & behavior appropriate  LABS/RADIOLOGY:  02-22-14 hemoglobin 9.8, MCV 84.5 otherwise CBC normal, glucose 103, albumin 3.3 otherwise CMP normal, INR 1.11, total cholesterol 173, triglycerides 189, HDL 33, LDL 102, urinalysis negative, urine drug screen negative, alcohol level less than 11   CT of the head-probable hyperdense left MCA, no hemorrhage MRI of the head-large acute nonhemorrhagic left MCA infarct MRA head-thrombosed left MCA branch vessels, no stenosis of left MCA Carotid Doppler-less than 39% stenosis bilaterally 2D echo-EF 55-60%, no definite cardiac source of a emboli Chest x-ray-negative EKG-sinus bradycardia   ASSESSMENT/PLAN:  Large left MCA infarct-patient has global aphasia, right  visual field cut, right hemiparesis and dysphagia. Continue rehabilitation. Diabetes mellitus  with vascular complications-well-controlled Hypothyroidism-continue levothyroxine Hypertension-well-controlled COPD-compensated Anemia-recheck GERD-continue Prilosec Check CBC  I spoke with patient's family at bedside and discussed her current condition.  I have reviewed patient's medical records received at admission/from hospitalization.  CPT CODE: [id number]  Angela CoxGayani Y Dasanayaka, MD [facility] [phone]

## 2014-03-22 NOTE — Consult Note (Signed)
I have reviewed and discussed case with Nurse Practitioner And agree with documentation and plan as noted above   Keithan Dileonardo J. Syris Brookens D.O.  Palliative Medicine Team at [facility]  Team Phone: [phone]    

## 2014-03-27 ENCOUNTER — Encounter: Payer: Self-pay | Admitting: Adult Health

## 2014-03-31 ENCOUNTER — Encounter: Payer: Self-pay | Admitting: Adult Health

## 2014-03-31 NOTE — Progress Notes (Signed)
This encounter was created in error - please disregard.

## 2014-04-13 ENCOUNTER — Encounter: Payer: Self-pay | Admitting: Adult Health

## 2014-04-13 ENCOUNTER — Non-Acute Institutional Stay (SKILLED_NURSING_FACILITY): Payer: Medicare Other | Admitting: Adult Health

## 2014-04-13 DIAGNOSIS — F411 Generalized anxiety disorder: Secondary | ICD-10-CM

## 2014-04-13 DIAGNOSIS — F419 Anxiety disorder, unspecified: Secondary | ICD-10-CM

## 2014-04-13 DIAGNOSIS — R0602 Shortness of breath: Secondary | ICD-10-CM | POA: Insufficient documentation

## 2014-04-13 DIAGNOSIS — R52 Pain, unspecified: Secondary | ICD-10-CM

## 2014-04-25 ENCOUNTER — Ambulatory Visit: Payer: Medicare Other | Admitting: Podiatry

## 2014-05-03 NOTE — Progress Notes (Signed)
Patient ID: Kayla Huynh, female   DOB: Feb 16, 1914, 80 y.o.   MRN: [id number]              PROGRESS NOTE  DATE:     Apr 22, 2014  FACILITY: Nursing Home Location: [facility]  LEVEL OF CARE: SNF (31)  Acute Visit  CHIEF COMPLAINT:  Manage SOB, Anxiety and Pain  HISTORY OF PRESENT ILLNESS: This is a 80 year old female who was noted to have SOB with O2 sat 82% - decreased. Patient noted to be staring and nonverbal. Breath sounds noted to have crackles. Family requested for patient to have comfort care and not be transferred to hospital.   PAST MEDICAL HISTORY : Reviewed.  No changes/see problem list  CURRENT MEDICATIONS: Reviewed per MAR/see medication list  REVIEW OF SYSTEMS: unable to obtain due to global aphasia  PHYSICAL EXAMINATION  GENERAL: thin body habitus NECK: supple, trachea midline, no neck masses, no thyroid tenderness, no thyromegaly LYMPHATICS: no LAN in the neck, no supraclavicular LAN RESPIRATORY: breathing is fast & labored, BS crackles on both lung fields CARDIAC: bradycardic, no murmur,no extra heart sounds, no edema GI: abdomen soft, normal BS, no masses, no tenderness, no hepatomegaly, no splenomegaly EXTREMITIES: right hemiparesis PSYCHIATRIC: the patient is anxious  LABS/RADIOLOGY: 02/28/14  WBC 7.5 hemoglobin 9.8 hematocrit 33.8 MCV 88.9 02/22/14  WBC 7.7 hemoglobin 10.4 hematocrit 32.2 sodium 145 potassium 3.7 glucose 103 BUN 9 creatinine 0.66 calcium 8.4 02/19/14  TSH 1.530 02/16/14  hemoglobin A1c 6.1 cholesterol 173 triglycerides 189 HDL 33 LDL 102  EXAM:  MRI HEAD WITHOUT CONTRAST  MRA HEAD WITHOUT CONTRAST  TECHNIQUE:  Multiplanar, multiecho pulse sequences of the brain and surrounding  structures were obtained without intravenous contrast. Angiographic  images of the head were obtained using MRA technique without  contrast.  COMPARISON: 02/16/2014 and 02/15/2014 CT. No comparison MR.  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 Mild  local mass effect upon the left lateral ventricle. There is minimal  bowing of the septum to the right.  Scattered small areas of blood breakdown products separate from the  left hemispheric infarct and most notable in the right  parietal-occipital lobe consistent with remote hemorrhagic infarct.  Small vessel disease type changes.  Atrophy without hydrocephalus.  Abnormal appearance left middle cerebral artery. Please see below.  Mild cervical spondylotic changes C4-5 level.  Nonspecific left parietal 1.4 cm scalp lesion.  MRA HEAD FINDINGS  Thrombosed left middle cerebral artery branch vessels. No high-grade  stenosis of the left middle cerebral artery bifurcation, M1 segment  left middle cerebral artery or left carotid terminus.  No significant narrowing of either internal carotid artery cavernous  or supraclinoid segment.  Mild to moderate narrowing A1 segment right anterior cerebral  artery. Mild narrowing M1 segment right middle cerebral artery.  Right middle cerebral artery branch vessel irregularity.  Abnormal appearance of the left vertebral artery which may reflect  proximal stenosis with poor distal flow.  No significant stenosis distal right vertebral artery.  Nonvisualized right posterior inferior cerebellar artery and left  anterior inferior cerebellar artery.  Mild to moderate narrowing portions of the basilar artery.  Fetal type contribution to the right posterior cerebral artery.  Posterior cerebral artery distal branch vessel narrowing and  irregularity bilaterally.  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  Scattered small areas of blood breakdown products separate from the  left hemispheric infarct and most notable in the right  parietal-occipital lobe consistent with remote hemorrhagic infarct.  MRA HEAD:  Thrombosed left middle cerebral artery branch vessels. No high-grade  stenosis of the left middle cerebral artery bifurcation, M1 segment  left middle cerebral artery or left carotid terminus.  No significant narrowing of either internal carotid artery cavernous  or supraclinoid segment.  Mild to moderate narrowing A1 segment right anterior cerebral  artery. Mild narrowing M1 segment right middle cerebral artery.  Right middle cerebral artery branch vessel irregularity.  Abnormal appearance of the left vertebral artery which may reflect  proximal stenosis with poor distal flow.  No significant stenosis distal right vertebral artery.  Nonvisualized right posterior inferior cerebellar artery and left  anterior inferior cerebellar artery.  Mild to moderate narrowing portions of the basilar artery.    ASSESSMENT/PLAN:  SOB - O2 10L/min via mask continuously; albuterol 2.5 mg/56ml 1 neb Q 6 hours; palliative consult with [facility] Generalized pain - Oxyfast 20 mg/ml give 5 mg/0.25 ml SL Q 4 hours PRN, Oxycodone 5 mg 1 tab SL X 1 now; family requested not to have CXR nor any labs done Anxiety - Ativan 0.5 mg 1 tab SL Q 6 hours PRN   CPT CODE: 16109  Ella Bodo - NP [facility] [phone]

## 2014-05-03 DEATH — deceased

## 2014-08-16 ENCOUNTER — Encounter (HOSPITAL_COMMUNITY): Payer: Self-pay | Admitting: Cardiovascular Disease

## 2016-05-20 IMAGING — CR DG CHEST 1V PORT
1 series · 1 of 1 positions shown · non-contrast
Comparison: None.

CLINICAL DATA: stroke

EXAM:
PORTABLE CHEST - 1 VIEW

[AP]
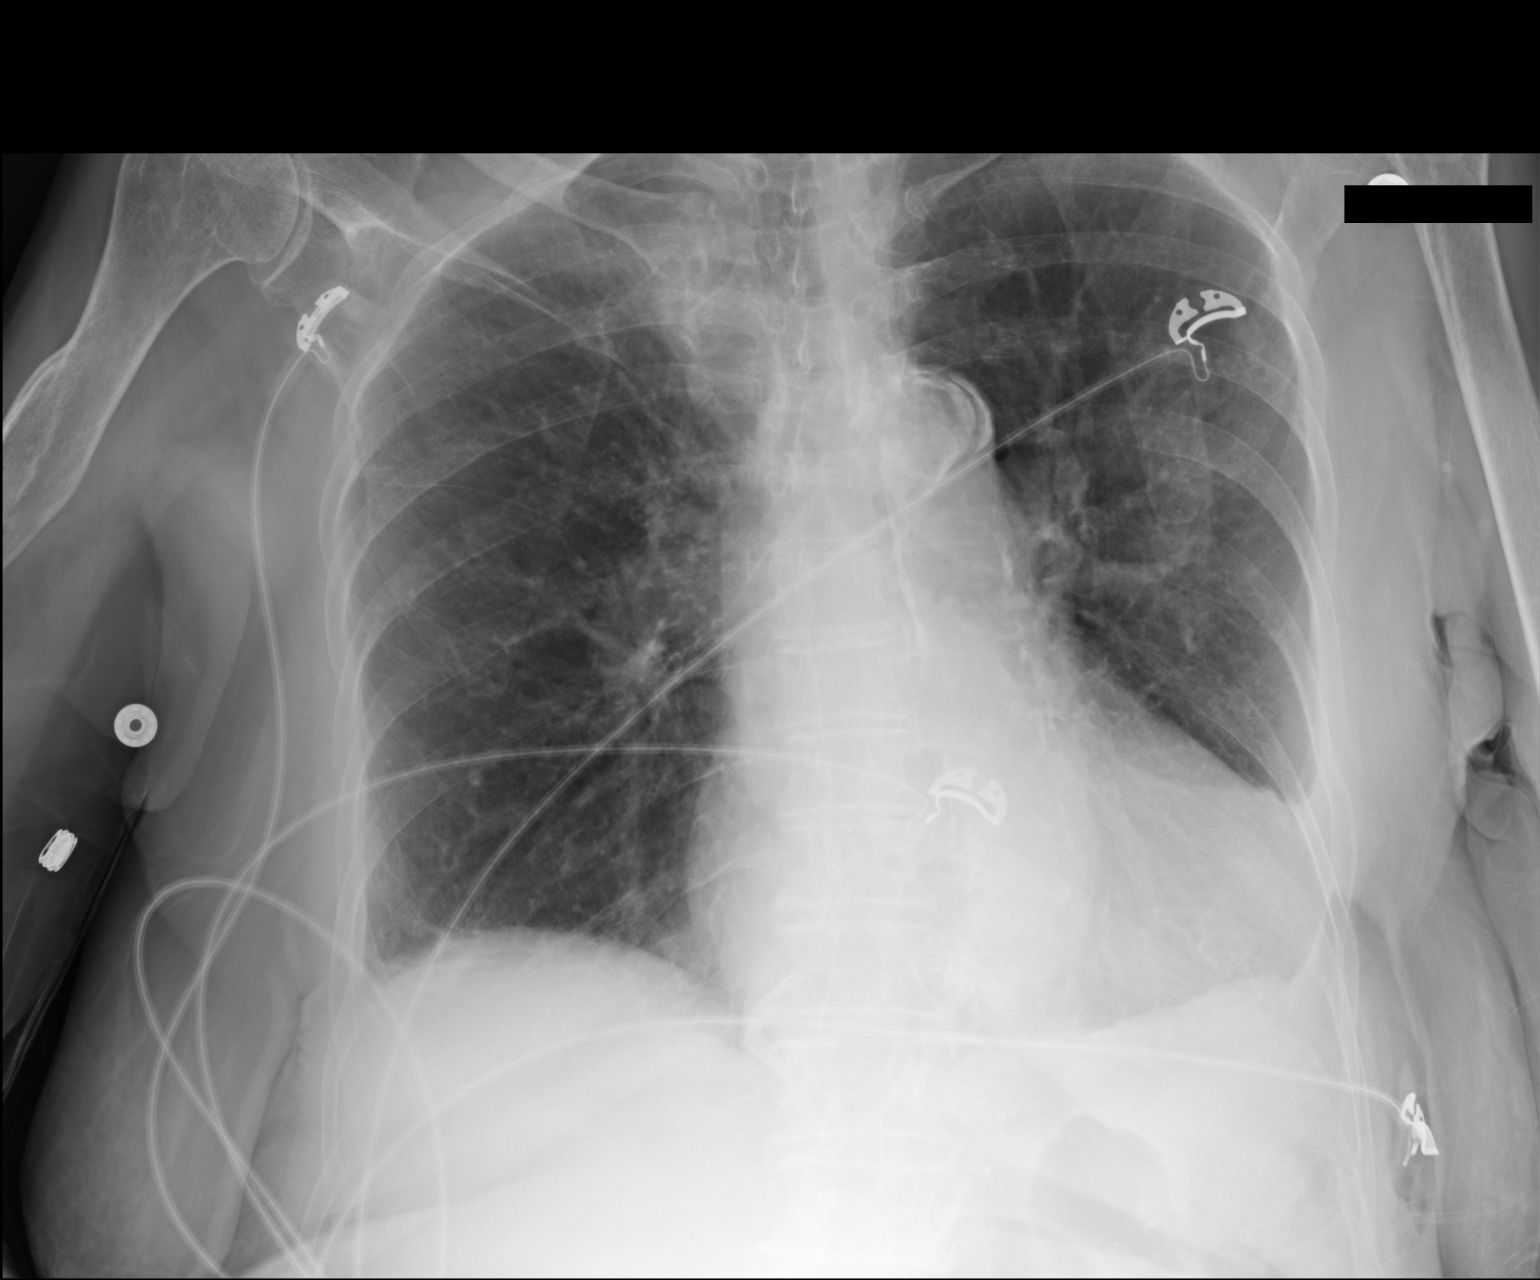

[1 of 1 positions shown; findings below may reference images not displayed]

FINDINGS: Enlarged cardiac silhouette and mediastinal contours with
atherosclerotic plaque within the thoracic aorta. The lungs appear
hyperexpanded. There is blunting of the bilateral costophrenic
angles suggestive of trace bilateral effusions. No definite evidence
of edema. Minimal bibasilar opacities favored to represent
atelectasis. No discrete focal airspace opacities. No pneumothorax.
No acute osseus abnormalities. Old/healed right midshaft humeral
fracture, incompletely imaged.
IMPRESSION: 1. Cardiomegaly and trace bilateral effusions without definite
evidence of edema.
2. Hyperexpanded lungs and bibasilar atelectasis without discrete
focal airspace opacities to suggest pneumonia. Further evaluation
with a PA and lateral chest radiograph may be obtained as clinically
indicated.

## 2016-05-24 IMAGING — RF DG ABDOMEN 1V
1 series · 1 of 1 positions shown · non-contrast
Comparison: 02/19/14 earlier today.

CLINICAL DATA: Status post panda tube insertion.

EXAM:
ABDOMEN - 1 VIEW

[Series 1: run · 1 of 1 slices shown]
[im 1/1]
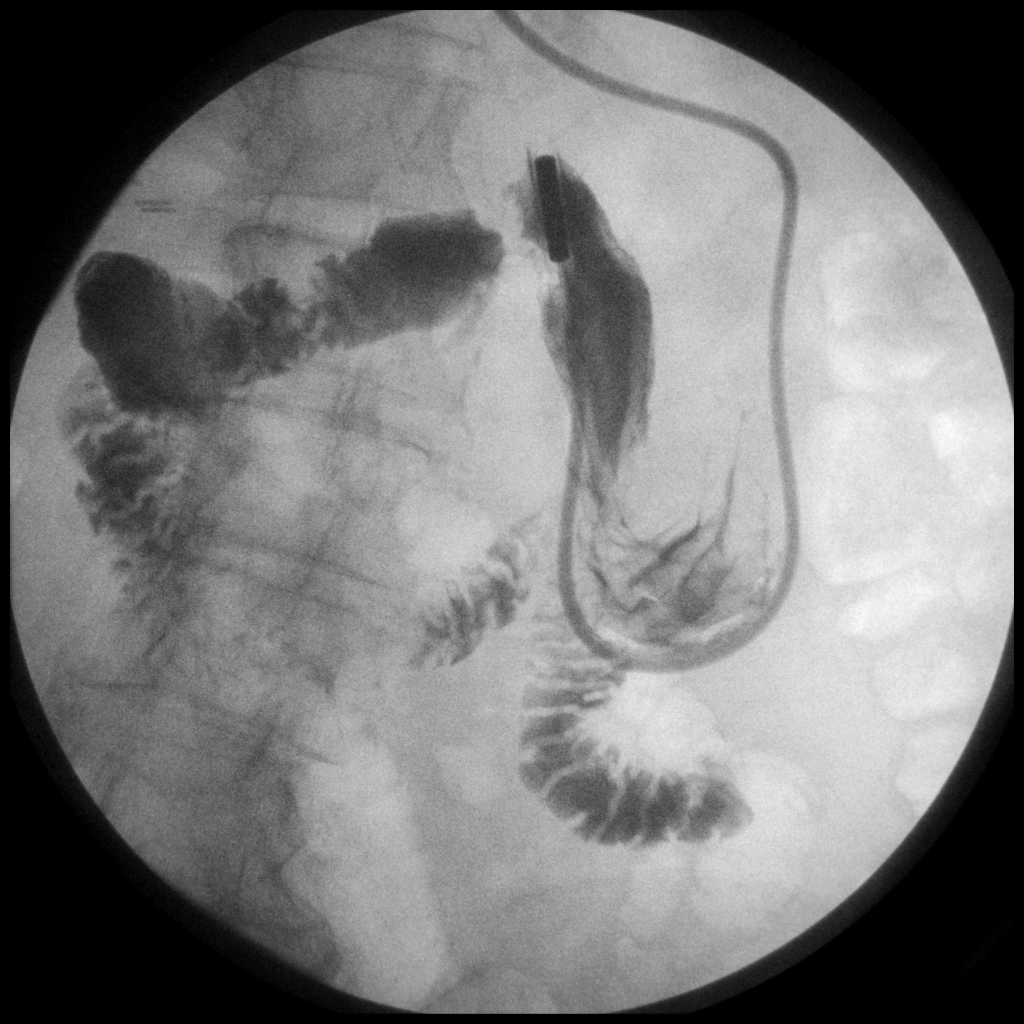

[1 of 1 positions shown; findings below may reference images not displayed]

FINDINGS: Feeding tube was inserted into the stomach. Is folded over on itself
and either terminates at the gastric antrum or proximal pylorus.
Small amount of contrast is seen entering in the proximal duodenum.
IMPRESSION: Feeding tube placed by technologist with tip in the region of the
pylorus.
